# Patient Record
Sex: Male | Born: 1940
Health system: Southern US, Community
[De-identification: ages and names within clinical notes are randomized; demographics above are authoritative.]

## PROBLEM LIST (undated history)

## (undated) DIAGNOSIS — I714 Abdominal aortic aneurysm, without rupture, unspecified: Secondary | ICD-10-CM

## (undated) DIAGNOSIS — T7840XA Allergy, unspecified, initial encounter: Secondary | ICD-10-CM

## (undated) DIAGNOSIS — E119 Type 2 diabetes mellitus without complications: Secondary | ICD-10-CM

## (undated) DIAGNOSIS — H269 Unspecified cataract: Secondary | ICD-10-CM

## (undated) DIAGNOSIS — H409 Unspecified glaucoma: Secondary | ICD-10-CM

## (undated) DIAGNOSIS — I10 Essential (primary) hypertension: Secondary | ICD-10-CM

## (undated) DIAGNOSIS — K219 Gastro-esophageal reflux disease without esophagitis: Secondary | ICD-10-CM

## (undated) DIAGNOSIS — E785 Hyperlipidemia, unspecified: Secondary | ICD-10-CM

## (undated) HISTORY — DX: Gastro-esophageal reflux disease without esophagitis: K21.9

## (undated) HISTORY — DX: Essential (primary) hypertension: I10

## (undated) HISTORY — DX: Hyperlipidemia, unspecified: E78.5

## (undated) HISTORY — DX: Unspecified cataract: H26.9

## (undated) HISTORY — PX: TONSILLECTOMY AND ADENOIDECTOMY: SHX28

## (undated) HISTORY — DX: Abdominal aortic aneurysm, without rupture, unspecified: I71.40

## (undated) HISTORY — DX: Type 2 diabetes mellitus without complications: E11.9

## (undated) HISTORY — PX: CATARACT EXTRACTION: SUR2

## (undated) HISTORY — DX: Allergy, unspecified, initial encounter: T78.40XA

## (undated) HISTORY — DX: Unspecified glaucoma: H40.9

## (undated) HISTORY — DX: Abdominal aortic aneurysm, without rupture: I71.4

---

## 2002-10-03 HISTORY — PX: COLONOSCOPY: SHX174

## 2003-05-09 ENCOUNTER — Ambulatory Visit (HOSPITAL_COMMUNITY): Admission: RE | Admit: 2003-05-09 | Discharge: 2003-05-09 | Payer: Self-pay | Admitting: Gastroenterology

## 2010-04-22 ENCOUNTER — Encounter: Admission: RE | Admit: 2010-04-22 | Discharge: 2010-04-22 | Payer: Self-pay | Admitting: Nephrology

## 2013-04-24 ENCOUNTER — Ambulatory Visit (INDEPENDENT_AMBULATORY_CARE_PROVIDER_SITE_OTHER): Payer: Medicare Other | Admitting: Physician Assistant

## 2013-04-24 ENCOUNTER — Encounter: Payer: Self-pay | Admitting: Physician Assistant

## 2013-04-24 VITALS — BP 136/90 | HR 76 | Temp 98.1°F | Resp 18 | Ht 70.0 in | Wt 175.0 lb

## 2013-04-24 DIAGNOSIS — E785 Hyperlipidemia, unspecified: Secondary | ICD-10-CM

## 2013-04-24 DIAGNOSIS — E119 Type 2 diabetes mellitus without complications: Secondary | ICD-10-CM | POA: Insufficient documentation

## 2013-04-24 DIAGNOSIS — I10 Essential (primary) hypertension: Secondary | ICD-10-CM | POA: Insufficient documentation

## 2013-04-24 MED ORDER — ATORVASTATIN CALCIUM 10 MG PO TABS: 10.0000 mg | ORAL_TABLET | Freq: Every day | ORAL | Status: DC

## 2013-04-24 MED ORDER — CLONIDINE HCL 0.1 MG PO TABS: 0.1000 mg | ORAL_TABLET | Freq: Every day | ORAL | Status: DC

## 2013-04-24 NOTE — Progress Notes (Signed)
Patient ID: Aaron Mcfarland MRN: [id number], DOB: [birth year], 72 y.o. Date of Encounter: @DATE @  Chief Complaint:  Chief Complaint  Patient presents with  . new pt est care    old PCP retired    HPI: 72 y.o. year old AA male  presents for initial OV at this office. He states his prior PCP, Dr. Audrie Lia (in [place]), retired 3 mos ago. LOV there was 01/2013. Last full lab panel was at [facility] 8/13. His wife, Marcelyn Ditty, recently became pt here and has been pleased so recommended he come here also since his PCP retired.  He has no complaints today. Has had no angina symptoms.   Worked at the [employer]. Played Softball until age 47!!  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  Taking all mes as listed in Medication Tab-No adverse effects.      Past Medical History  Diagnosis Date  . Diabetes mellitus without complication   . Hyperlipidemia   . Hypertension      Home Meds: See attached medication section for current medication list. Any medications entered into computer today will not appear on this note's list. The medications listed below were entered prior to today. No current outpatient prescriptions on file prior to visit.   No current facility-administered medications on file prior to visit.    Allergies: No Known Allergies  History   Social History  . Marital Status: Married    Spouse Name: N/A    Number of Children: N/A  . Years of Education: N/A   Occupational History  . Not on file.   Social History Main Topics  . Smoking status: Former Smoker    Quit date: 04/24/1966  . Smokeless tobacco: Never Used  . Alcohol Use: Yes  . Drug Use: No  . Sexually Active: Yes    Birth Control/ Protection: None   Other Topics Concern  . Not on file   Social History Narrative  . No narrative on file    Family History  Problem Relation Age of Onset  . Cancer Mother     ovarian  . Hypertension Father   . Stroke Father   . Diabetes Brother   .  Heart disease Brother      Review of Systems:  See HPI for pertinent ROS. All other ROS negative.    Physical Exam: Blood pressure 136/90, pulse 76, temperature 98.1 F (36.7 C), temperature source Oral, resp. rate 18, height 5\' 10"  (1.778 m), weight 175 lb (79.379 kg)., Body mass index is 25.11 kg/(m^2). General:WNWD AAM.  Appears in no acute distress. Neck: Supple. No thyromegaly. No lymphadenopathy. No carotid Bruit.  Lungs: Clear bilaterally to auscultation without wheezes, rales, or rhonchi. Breathing is unlabored. Heart: RRR with S1 S2. No murmurs, rubs, or gallops. Abdomen: Soft, non-tender, non-distended with normoactive bowel sounds. No hepatomegaly. No rebound/guarding. No obvious abdominal masses. Musculoskeletal:  Strength and tone normal for age. Extremities/Skin: Warm and dry.  No edema. No rashes or suspicious lesions. Neuro: Alert and oriented X 3. Moves all extremities spontaneously. Gait is normal. CNII-XII grossly in tact. Psych:  Responds to questions appropriately with a normal affect.     ASSESSMENT AND PLAN:  72 y.o. year old male with  1. Diabetes mellitus without complication - COMPLETE METABOLIC PANEL WITH GFR - Hemoglobin A1c - Microalbumin, urine  2. Hyperlipidemia - COMPLETE METABOLIC PANEL WITH GFR - Lipid panel - atorvastatin (LIPITOR) 10 MG tablet; Take 1 tablet (10 mg total) by  mouth at bedtime.  Dispense: 30 tablet; Refill: 5  3. Hypertension - COMPLETE METABOLIC PANEL WITH GFR - cloNIDine (CATAPRES) 0.1 MG tablet; Take 1 tablet (0.1 mg total) by mouth daily.  Dispense: 60 tablet; Refill: 5  Will check labs to monitor. Cont current meds at this time. ROV 3 months, sooner if needed.  Will discuss preventive care, etc at next OV.   [address] [place], [place], [facility] 04/24/2013 5:19 PM

## 2013-04-25 ENCOUNTER — Telehealth: Payer: Self-pay | Admitting: Family Medicine

## 2013-04-25 LAB — COMPLETE METABOLIC PANEL WITH GFR
Albumin: 4.9 g/dL (ref 3.5–5.2)
CO2: 25 mEq/L (ref 19–32)
Chloride: 100 mEq/L (ref 96–112)
Creat: 0.95 mg/dL (ref 0.50–1.35)
GFR, Est Non African American: 80 mL/min
Glucose, Bld: 124 mg/dL — ABNORMAL HIGH (ref 70–99)
Potassium: 4.2 mEq/L (ref 3.5–5.3)
Total Protein: 7.9 g/dL (ref 6.0–8.3)

## 2013-04-25 LAB — LIPID PANEL: Cholesterol: 118 mg/dL (ref 0–200)

## 2013-04-25 NOTE — Telephone Encounter (Signed)
Pt aware of lab results and medications changes.  Has already made next follow up appt.

## 2013-04-25 NOTE — Telephone Encounter (Signed)
Message copied by Donne Anon on Thu Apr 25, 2013  6:06 PM ------      Message from: Allayne Butcher      Created: Thu Apr 25, 2013 12:40 PM       Cholesterol is very low. Can decrease lipitor-currnently on 10mg . If these can be cut in half, can decrease to 1/2 daily. Rest of labs are good. Cont all other meds the same as discussed at OV. ------

## 2013-06-26 ENCOUNTER — Telehealth: Payer: Self-pay | Admitting: Physician Assistant

## 2013-06-26 MED ORDER — METFORMIN HCL 1000 MG PO TABS: 1000.0000 mg | ORAL_TABLET | Freq: Two times a day (BID) | ORAL | Status: DC

## 2013-06-26 MED ORDER — TRIAMTERENE-HCTZ 37.5-25 MG PO TABS: 1.0000 | ORAL_TABLET | Freq: Every day | ORAL | Status: DC

## 2013-06-26 NOTE — Telephone Encounter (Signed)
Medication refilled per protocol. 

## 2013-06-26 NOTE — Telephone Encounter (Signed)
Maxide 25 mg 1 QD #30 Metformin 1000 mg 1 BID #60

## 2013-07-25 ENCOUNTER — Ambulatory Visit (INDEPENDENT_AMBULATORY_CARE_PROVIDER_SITE_OTHER): Payer: Medicare Other | Admitting: Physician Assistant

## 2013-07-25 ENCOUNTER — Other Ambulatory Visit: Payer: Self-pay | Admitting: Family Medicine

## 2013-07-25 ENCOUNTER — Encounter: Payer: Self-pay | Admitting: Physician Assistant

## 2013-07-25 VITALS — BP 120/76 | HR 72 | Temp 98.1°F | Resp 18 | Ht 69.0 in | Wt 174.0 lb

## 2013-07-25 DIAGNOSIS — Z23 Encounter for immunization: Secondary | ICD-10-CM

## 2013-07-25 DIAGNOSIS — I10 Essential (primary) hypertension: Secondary | ICD-10-CM

## 2013-07-25 DIAGNOSIS — E119 Type 2 diabetes mellitus without complications: Secondary | ICD-10-CM

## 2013-07-25 DIAGNOSIS — E785 Hyperlipidemia, unspecified: Secondary | ICD-10-CM

## 2013-07-25 MED ORDER — GLUCOSE BLOOD VI STRP: ORAL_STRIP | Status: DC

## 2013-07-25 NOTE — Telephone Encounter (Signed)
Test strips refilled

## 2013-07-26 NOTE — Progress Notes (Signed)
Patient ID: Aaron Mcfarland MRN: [id number], DOB: 1941/01/30, 72 y.o. Date of Encounter: @DATE @  Chief Complaint:  Chief Complaint  Patient presents with  . 3 mth check up    is fasting    HPI: 72 y.o. year old AA male  presents for followup office visit.  I saw him as any patient to our office on 04/24/13. At that time he reported that his prior PCP, Dr. Audrie Lia (in [place]), retired 3 months prior. He reported that the last office visit there was April 2014. He reported that his last full lab panel was at complete physical exam in August 2013. He reported that his wife, Aaron Mcfarland, recently became a patient at our office and had been pleased they recommended that he come to our office also since his PCP and retired.  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 He played softball until age 34!!! 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 He was a Naval architect.  AT that visit he had no complaints.  At that visit we made no changes to his medications. Obtain the labs and plan for followup visit in 3 months. He returns for that followup visit today.  He continues to have no complaints. He states that he walks 40 minutes twice a day. As well he uses his a laparoscopic cholecystectomy for 15 minutes per day and uses his rowing machine. He does all of his exercise 5 days per week.  As well he is careful with his diet and does monitor his carbohydrate intake.    Past Medical History  Diagnosis Date  . Diabetes mellitus without complication   . Hyperlipidemia   . Hypertension      Home Meds: See attached medication section for current medication list. Any medications entered into computer today will not appear on this note's list. The medications listed below were entered prior to today. Current Outpatient Prescriptions on File Prior to Visit  Medication Sig Dispense Refill  . atorvastatin (LIPITOR) 10 MG tablet Take 1 tablet (10 mg total) by mouth at bedtime.  30 tablet  5  .  cetirizine (ZYRTEC) 10 MG tablet Take 10 mg by mouth daily.      . cloNIDine (CATAPRES) 0.1 MG tablet Take 1 tablet (0.1 mg total) by mouth daily.  60 tablet  5  . Lancets (FREESTYLE) lancets 1 each by Other route daily. Use as instructed      . metFORMIN (GLUCOPHAGE) 1000 MG tablet Take 1 tablet (1,000 mg total) by mouth 2 (two) times daily with a meal.  60 tablet  1  . Multiple Vitamins-Minerals (MULTIVITAMIN PO) Take by mouth.      . triamterene-hydrochlorothiazide (MAXZIDE-25) 37.5-25 MG per tablet Take 1 tablet by mouth daily.  30 tablet  1   No current facility-administered medications on file prior to visit.    Allergies: No Known Allergies  History   Social History  . Marital Status: Married    Spouse Name: N/A    Number of Children: N/A  . Years of Education: N/A   Occupational History  . Not on file.   Social History Main Topics  . Smoking status: Former Smoker    Quit date: 04/24/1966  . Smokeless tobacco: Never Used  . Alcohol Use: Yes  . Drug Use: No  . Sexual Activity: Yes    Birth Control/ Protection: None   Other Topics Concern  . Not on file   Social History Narrative  . No narrative  on file    Family History  Problem Relation Age of Onset  . Cancer Mother     ovarian  . Hypertension Father   . Stroke Father   . Diabetes Brother   . Heart disease Brother      Review of Systems:  See HPI for pertinent ROS. All other ROS negative.    Physical Exam: Blood pressure 120/76, pulse 72, temperature 98.1 F (36.7 C), temperature source Oral, resp. rate 18, height 5\' 9"  (1.753 m), weight 174 lb (78.926 kg)., Body mass index is 25.68 kg/(m^2). General: WNWD AAM. Appears in no acute distress. Neck: Supple. No thyromegaly. No lymphadenopathy. No carotid bruits. Lungs: Clear bilaterally to auscultation without wheezes, rales, or rhonchi. Breathing is unlabored. Heart: RRR with S1 S2. No murmurs, rubs, or gallops. Abdomen: Soft, non-tender, non-distended  with normoactive bowel sounds. No hepatomegaly. No rebound/guarding. No obvious abdominal masses. Musculoskeletal:  Strength and tone normal for age. Extremities/Skin: Warm and dry. No clubbing or cyanosis. No edema. No rashes or suspicious lesions. Neuro: Alert and oriented X 3. Moves all extremities spontaneously. Gait is normal. CNII-XII grossly in tact. Psych:  Responds to questions appropriately with a normal affect. Diabetic foot exam:  Inspection is normal with no open wounds and no calluses or concerning areas. Sensation is intact throughout. He has 2+ dorsalis pedis and posterior tibial pulses bilaterally.      ASSESSMENT AND PLAN:  72 y.o. year old male with  1. Diabetes mellitus without complication Microalbumin was performed July 2014 and was normal. - Hemoglobin A1C, fingerstick  2. Hyperlipidemia Lipid panel was performed 04/25/13. LDL was down at 43. I recommended that he could decrease his Lipitor and cut it in. He states that he didn't get that message and did follow up with that. He is taking one half of his Lipitor 10 daily 5 mg daily. LFT was normal 04/25/13 We'll wait to recheck FLP and LFTs at next visit.  3. Hypertension BP is at goal at 120/76 today. Continue current medication. BMET was normal 04/25/13  #4 colorectal cancer screening: Patient states that his last colonoscopy was 10 years ago. He states that he just recently received a letter informing him to schedule followup. He states that he will schedule this and is aware.  5. prostate cancer screening: No further screening is indicated as he is now age 72.  6. Immunizations: Influenza vaccine: He reports that he is only gotten a flu vaccine for this year. He reports he knows he has not had a tetanus shot in the last 10 years. Agreeable to update now. As well he reports that he knows he has not had a pneumonia vaccine. Agreeable to update now.  4. Need for Tdap vaccination - Tdap vaccine greater than or  equal to 7yo IM  5. Need for pneumococcal vaccine - Pneumococcal polysaccharide vaccine 23-valent greater than or equal to 2yo subcutaneous/IM  Zostavax: As this immunization. Discussed that he needs to check with his insurance regarding coverage said he knows his cost and he can decide whether he was to follow up with this.  Routine office visit in 3 months or sooner if he needs this.  [address] [place], [place], [facility] 07/26/2013 7:30 PM

## 2013-08-02 ENCOUNTER — Telehealth: Payer: Self-pay | Admitting: Family Medicine

## 2013-08-02 NOTE — Telephone Encounter (Signed)
Pt aware of lab results and provider recommendations 

## 2013-08-02 NOTE — Telephone Encounter (Signed)
Message copied by Donne Anon on Fri Aug 02, 2013  2:10 PM ------      Message from: Allayne Butcher      Created: Thu Jul 25, 2013  7:05 PM       Since A1c is down to 5.9.      Currently on metformin 1000 mg twice a day.      I am concerned he's going to have hypoglycemia episodes.       decrease metformin to 500 mg twice a day.      Send Prescription for this #60 with 5 refills. ------

## 2013-10-28 ENCOUNTER — Encounter: Payer: Self-pay | Admitting: Physician Assistant

## 2013-10-28 ENCOUNTER — Ambulatory Visit (INDEPENDENT_AMBULATORY_CARE_PROVIDER_SITE_OTHER): Payer: Medicare Other | Admitting: Physician Assistant

## 2013-10-28 VITALS — BP 114/70 | HR 68 | Temp 97.8°F | Resp 18 | Wt 178.0 lb

## 2013-10-28 DIAGNOSIS — E119 Type 2 diabetes mellitus without complications: Secondary | ICD-10-CM

## 2013-10-28 DIAGNOSIS — B9689 Other specified bacterial agents as the cause of diseases classified elsewhere: Secondary | ICD-10-CM

## 2013-10-28 DIAGNOSIS — I10 Essential (primary) hypertension: Secondary | ICD-10-CM

## 2013-10-28 DIAGNOSIS — Z1212 Encounter for screening for malignant neoplasm of rectum: Secondary | ICD-10-CM

## 2013-10-28 DIAGNOSIS — E785 Hyperlipidemia, unspecified: Secondary | ICD-10-CM

## 2013-10-28 DIAGNOSIS — A499 Bacterial infection, unspecified: Secondary | ICD-10-CM

## 2013-10-28 DIAGNOSIS — Z1211 Encounter for screening for malignant neoplasm of colon: Secondary | ICD-10-CM

## 2013-10-28 DIAGNOSIS — J069 Acute upper respiratory infection, unspecified: Secondary | ICD-10-CM

## 2013-10-28 LAB — LIPID PANEL
CHOL/HDL RATIO: 2.2 ratio
Cholesterol: 131 mg/dL (ref 0–200)
HDL: 60 mg/dL (ref >39)
LDL Cholesterol: 44 mg/dL (ref 0–99)
Triglycerides: 136 mg/dL (ref <150)
VLDL: 27 mg/dL (ref 0–40)

## 2013-10-28 LAB — COMPLETE METABOLIC PANEL WITH GFR
ALBUMIN: 4.6 g/dL (ref 3.5–5.2)
ALT: 20 U/L (ref 0–53)
AST: 16 U/L (ref 0–37)
Alkaline Phosphatase: 74 U/L (ref 39–117)
BUN: 17 mg/dL (ref 6–23)
CHLORIDE: 102 meq/L (ref 96–112)
CO2: 27 meq/L (ref 19–32)
CREATININE: 1.07 mg/dL (ref 0.50–1.35)
Calcium: 10.5 mg/dL (ref 8.4–10.5)
GFR, Est African American: 79 mL/min
GFR, Est Non African American: 68 mL/min
GLUCOSE: 143 mg/dL — AB (ref 70–99)
Potassium: 4.3 mEq/L (ref 3.5–5.3)
Sodium: 138 mEq/L (ref 135–145)
TOTAL PROTEIN: 7.7 g/dL (ref 6.0–8.3)
Total Bilirubin: 0.8 mg/dL (ref 0.3–1.2)

## 2013-10-28 LAB — HEMOGLOBIN A1C
Hgb A1c MFr Bld: 6.6 % — ABNORMAL HIGH (ref <5.7)
MEAN PLASMA GLUCOSE: 143 mg/dL — AB (ref <117)

## 2013-10-28 MED ORDER — AMOXICILLIN-POT CLAVULANATE 875-125 MG PO TABS: 1.0000 | ORAL_TABLET | Freq: Two times a day (BID) | ORAL | Status: DC

## 2013-10-28 NOTE — Progress Notes (Signed)
Patient ID: Aaron Mcfarland MRN: [id number], DOB: June 14, 1941, 73 y.o. Date of Encounter: @DATE @  Chief Complaint:  Chief Complaint  Patient presents with  . 3 mth check up    is fasting    HPI: 73 y.o. year old AA male  presents for routine followup office visit.  Today he has one complaint. States he has had thick mucus from his nose for the past month. Also drainage to his throat. No congestion in his chest. Has had no fevers or chills. He has even checked his temperature sometimes and has had no fever.  I saw him as a new patient to our office on 04/24/13. At that time he reported that his prior PCP, who was in [place], had retired 3 months prior. He reported that his last visit there was April 2014. He reported that his wife, [name], recently became a patient at this office and had been pleased. She recommended that he come to our office also since his PCP retired. 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 Played softball until age 66!!! 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 He was a Industrial/product designer.   At His last visit he stated he was walking 40 minutes twice a day. He does this walking with his wife. Today says that b/c of the weather, they are now doing this once a day around 2 PM. He is still also doing his elliptical and bicycle for 15 minutes per day and uses a rowing machine. All does exercise 5 days per week.  He checks his blood pressure every day and gets good readings. He checks his blood sugar every morning and gets between 90-120. He did not bring in the log sheet today.  At prior lab we have told him to cut his Lipitor dose in half and he says that he did this. As well at last lab we have told him to cut his metformin down in half from 1000 twice a day to 500 twice a day. He also reports that he did make this change.  Taking  all medications as directed with no adverse effects.   Past Medical History  Diagnosis Date  . Diabetes mellitus without  complication   . Hyperlipidemia   . Hypertension      Home Meds: See attached medication section for current medication list. Any medications entered into computer today will not appear on this note's list. The medications listed below were entered prior to today. Current Outpatient Prescriptions on File Prior to Visit  Medication Sig Dispense Refill  . atorvastatin (LIPITOR) 10 MG tablet Take 1 tablet (10 mg total) by mouth at bedtime.  30 tablet  5  . cetirizine (ZYRTEC) 10 MG tablet Take 10 mg by mouth daily.      . cloNIDine (CATAPRES) 0.1 MG tablet Take 1 tablet (0.1 mg total) by mouth daily.  60 tablet  5  . FREESTYLE TEST STRIPS test strip       . Lancets (FREESTYLE) lancets 1 each by Other route daily. Use as instructed      . metFORMIN (GLUCOPHAGE) 500 MG tablet Take 500 mg by mouth 2 (two) times daily with a meal.      . Multiple Vitamins-Minerals (MULTIVITAMIN PO) Take by mouth.      . triamterene-hydrochlorothiazide (MAXZIDE-25) 37.5-25 MG per tablet Take 1 tablet by mouth daily.  30 tablet  1   No current facility-administered medications on file prior to visit.  Allergies: No Known Allergies  History   Social History  . Marital Status: Married    Spouse Name: N/A    Number of Children: N/A  . Years of Education: N/A   Occupational History  . Not on file.   Social History Main Topics  . Smoking status: Former Smoker    Quit date: 04/24/1966  . Smokeless tobacco: Never Used  . Alcohol Use: Yes  . Drug Use: No  . Sexual Activity: Yes    Birth Control/ Protection: None   Other Topics Concern  . Not on file   Social History Narrative  . No narrative on file    Family History  Problem Relation Age of Onset  . Cancer Mother     ovarian  . Hypertension Father   . Stroke Father   . Diabetes Brother   . Heart disease Brother      Review of Systems:  See HPI for pertinent ROS. All other ROS negative.    Physical Exam: Blood pressure 114/70, pulse  68, temperature 97.8 F (36.6 C), temperature source Oral, resp. rate 18, weight 178 lb (80.74 kg)., Body mass index is 26.27 kg/(m^2). General: WNWD AAM. Appears in no acute distress. Head: Normocephalic, atraumatic, eyes without discharge, sclera non-icteric, nares are without discharge. Bilateral auditory canals clear, TM's are without perforation, pearly grey and translucent with reflective cone of light bilaterally. Oral cavity moist, posterior pharynx without exudate, erythema, peritonsillar abscess. No tenderness of the sinuses with percussion. Neck: Supple. No thyromegaly. No lymphadenopathy. No carotid bruits. Lungs: Clear bilaterally to auscultation without wheezes, rales, or rhonchi. Breathing is unlabored. Heart: RRR with S1 S2. No murmurs, rubs, or gallops. Abdomen: Soft, non-tender, non-distended with normoactive bowel sounds. No hepatomegaly. No rebound/guarding. No obvious abdominal masses. Musculoskeletal:  Strength and tone normal for age. Extremities/Skin: Warm and dry. No clubbing or cyanosis. No edema. No rashes or suspicious lesions. Neuro: Alert and oriented X 3. Moves all extremities spontaneously. Gait is normal. CNII-XII grossly in tact. Psych:  Responds to questions appropriately with a normal affect. Diabetic foot exam: Inspection is normal. No other wounds and no calluses or concerning areas. Is intact throughout. He has 2+ dorsalis pedis and posterior tibial pulses bilaterally.      ASSESSMENT AND PLAN:  73 y.o. year old male with  1. Diabetes mellitus without complication - COMPLETE METABOLIC PANEL WITH GFR - Hemoglobin A1c  He reports he has not had an eye exam in almost 15 years. Discussed need for diabetic eye exam. He is on statin. He is on no ACE inhibitor.  Next office visit we'll discuss decreasing his current blood pressure medication so that we can start a low-dose ACE inhibitor.   2. Hyperlipidemia - COMPLETE METABOLIC PANEL WITH GFR - Lipid  panel He did decrease his Lipitor from 10 mg to half of a pill for 5 mg at the time of his labs 7/14. Recheck now.  3. Hypertension - COMPLETE METABOLIC PANEL WITH GFR Blood pressure is at goal. He is on no ACE inhibitor. His next office visit we will discuss decreasing his dose of current medications and we can start low-dose ACE inhibitor.  4. Screening for colorectal cancer At his last visit we discussed the fact that his last colonoscopy was 10 years ago. He told me he just received a letter informing him to schedule followup. Today I  asked him about this and he said he called his insurance and they were going to cover 80% but not  the full cost. He states that he cannot afford this. I discussed getting Hemoccults and he agrees with doing this today.  5. prostate cancer screening: No further screening indicated as he is now age 42.  57. Immunizations:  Influenza vaccine:He has had for this year Tetanus: This was updated at our visit 07/25/13 Pneumonia vaccine: He was given Pneumovax 23 at a visit 07/25/13. Zostavax: Discussed this at the last visit. He says that he checked and his cost is going to be $95 and he defers.  7. Bacterial upper respiratory infection - amoxicillin-clavulanate (AUGMENTIN) 875-125 MG per tablet; Take 1 tablet by mouth 2 (two) times daily.  Dispense: 20 tablet; Refill: 0  Office visit 3 months or sooner if needed.  [address] [place], [place], [facility] 10/28/2013 9:53 AM

## 2013-10-29 ENCOUNTER — Telehealth: Payer: Self-pay | Admitting: Family Medicine

## 2013-10-29 NOTE — Telephone Encounter (Signed)
Message copied by Olena Mater on Tue Oct 29, 2013 10:10 AM ------      Message from: Dena Billet      Created: Tue Oct 29, 2013  8:37 AM       Tell Patient that his A1c is good. Continue current diabetic medication the same.      At the time of his prior cholesterol panel, we had decreased the dose of his medication to where he is only taking a half of a very low dose pill daily. Tell him that the cholesterol number is still very low. Can actually take a half of a pill just on Monday Wednesday Fridays only.      Rest of labs are normal. Other than decreasing the cholesterol pill as above, continue all other medications the same as at present. ------

## 2013-10-29 NOTE — Telephone Encounter (Signed)
Pt aware of lab results and provider recommendations 

## 2013-10-31 ENCOUNTER — Other Ambulatory Visit: Payer: Medicare Other

## 2013-10-31 DIAGNOSIS — Z1212 Encounter for screening for malignant neoplasm of rectum: Principal | ICD-10-CM

## 2013-10-31 DIAGNOSIS — Z1211 Encounter for screening for malignant neoplasm of colon: Secondary | ICD-10-CM

## 2013-11-01 LAB — FECAL OCCULT BLOOD, IMMUNOCHEMICAL
FECAL OCCULT BLOOD: NEGATIVE
FECAL OCCULT BLOOD: NEGATIVE
Fecal Occult Blood: NEGATIVE

## 2013-11-02 ENCOUNTER — Other Ambulatory Visit: Payer: Self-pay | Admitting: Physician Assistant

## 2013-11-04 NOTE — Telephone Encounter (Signed)
Medication refilled per protocol. 

## 2013-11-05 ENCOUNTER — Encounter: Payer: Self-pay | Admitting: Family Medicine

## 2013-11-13 ENCOUNTER — Telehealth: Payer: Self-pay | Admitting: Physician Assistant

## 2013-11-13 NOTE — Telephone Encounter (Signed)
Pt states better but still with some lingering congestion.  No fever.  No discolored secretions.  Told him antibiotic has killed the bacteria but needs some OTC expectorant to help him clear the congestion.  Recommended Mucinex.  Follow package directions and drink plenty of fluids.  If still not gone or fever or discolored secretions return, we will need to see to refill antibiotics

## 2013-11-13 NOTE — Telephone Encounter (Signed)
Call back number is [phone] Pharmacy is [facility] Pt is wanting a refill on  amoxicillin-clavulanate (AUGMENTIN) 875-125 MG per tablet He is not getting any better

## 2013-11-14 NOTE — Telephone Encounter (Signed)
Agree 

## 2014-01-27 ENCOUNTER — Ambulatory Visit (INDEPENDENT_AMBULATORY_CARE_PROVIDER_SITE_OTHER): Payer: Medicare Other | Admitting: Physician Assistant

## 2014-01-27 ENCOUNTER — Encounter: Payer: Self-pay | Admitting: Physician Assistant

## 2014-01-27 VITALS — BP 130/84 | HR 68 | Temp 97.6°F | Resp 18 | Wt 177.0 lb

## 2014-01-27 DIAGNOSIS — I10 Essential (primary) hypertension: Secondary | ICD-10-CM

## 2014-01-27 DIAGNOSIS — Z1212 Encounter for screening for malignant neoplasm of rectum: Secondary | ICD-10-CM

## 2014-01-27 DIAGNOSIS — E119 Type 2 diabetes mellitus without complications: Secondary | ICD-10-CM

## 2014-01-27 DIAGNOSIS — Z1211 Encounter for screening for malignant neoplasm of colon: Secondary | ICD-10-CM

## 2014-01-27 DIAGNOSIS — E785 Hyperlipidemia, unspecified: Secondary | ICD-10-CM

## 2014-01-27 LAB — HEMOGLOBIN A1C
Hgb A1c MFr Bld: 6.8 % — ABNORMAL HIGH (ref <5.7)
MEAN PLASMA GLUCOSE: 148 mg/dL — AB (ref <117)

## 2014-01-27 MED ORDER — BENAZEPRIL HCL 10 MG PO TABS
10.0000 mg | ORAL_TABLET | Freq: Every day | ORAL | Status: DC
Start: 2014-01-27 — End: 2014-02-10

## 2014-01-27 MED ORDER — ASPIRIN 81 MG PO TABS: 81.0000 mg | ORAL_TABLET | Freq: Every day | ORAL | Status: AC

## 2014-01-27 NOTE — Progress Notes (Signed)
Patient ID: Aaron Mcfarland MRN: [id number], DOB: Dec 07, 1940, 73 y.o. Date of Encounter: @DATE @  Chief Complaint:  Chief Complaint  Patient presents with  . 3 mth check up    is fasting    HPI: 73 y.o. year old AA male  presents for routine followup office visit.   I saw him as a new patient to our office on 04/24/13. At that time he reported that his prior PCP, who was in [place], had retired 3 months prior. He reported that his last visit there was April 2014. He reported that his wife, Aaron Mcfarland, recently became a patient at this office and had been pleased. She recommended that he come to our office also since his PCP retired. 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 Played softball until age 45!!! 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   At prior visit he stated he was walking 40 minutes twice a day. He does this walking with his wife. At [place] in January 2015-- he  said that b/c of the weather, they are now doing this once a day around 2 PM. He is still also doing his elliptical and bicycle for 15 minutes per day and uses a rowing machine. All does exercise 5 days per week.  He checks his blood pressure every day and gets good readings. He checks his blood sugar every morning and gets between 90-120. He did not bring in the log sheet today.  At prior lab we have told him to cut his Lipitor dose in half and he says that he did this. At lab 10/2013 told him to take 1/2 on MOn, Wed, Fri only--says he is doing this.  As well at last lab we have told him to cut his metformin down in half from 1000 twice a day to 500 twice a day. He also reports that he did make this change.  Taking  all medications as directed with no adverse effects.   Past Medical History  Diagnosis Date  . Diabetes mellitus without complication   . Hyperlipidemia   . Hypertension      Home Meds: See attached medication section for current medication list. Any  medications entered into computer today will not appear on this note's list. The medications listed below were entered prior to today. Current Outpatient Prescriptions on File Prior to Visit  Medication Sig Dispense Refill  . atorvastatin (LIPITOR) 10 MG tablet Take 1 tablet (10 mg total) by mouth at bedtime.  30 tablet  5  . cetirizine (ZYRTEC) 10 MG tablet Take 10 mg by mouth daily.      Marland Kitchen FREESTYLE TEST STRIPS test strip       . Lancets (FREESTYLE) lancets 1 each by Other route daily. Use as instructed      . metFORMIN (GLUCOPHAGE) 500 MG tablet Take 500 mg by mouth 2 (two) times daily with a meal.      . Multiple Vitamins-Minerals (MULTIVITAMIN PO) Take by mouth.      . triamterene-hydrochlorothiazide (MAXZIDE-25) 37.5-25 MG per tablet TAKE ONE TABLET BY MOUTH ONCE DAILY  30 tablet  2   No current facility-administered medications on file prior to visit.    Allergies: No Known Allergies  History   Social History  . Marital Status: Married    Spouse Name: N/A    Number of Children: N/A  . Years of Education: N/A   Occupational History  . Not on file.  Social History Main Topics  . Smoking status: Former Smoker    Quit date: 04/24/1966  . Smokeless tobacco: Never Used  . Alcohol Use: Yes  . Drug Use: No  . Sexual Activity: Yes    Birth Control/ Protection: None   Other Topics Concern  . Not on file   Social History Narrative  . No narrative on file    Family History  Problem Relation Age of Onset  . Cancer Mother     ovarian  . Hypertension Father   . Stroke Father   . Diabetes Brother   . Heart disease Brother      Review of Systems:  See HPI for pertinent ROS. All other ROS negative.    Physical Exam: Blood pressure 130/84, pulse 68, temperature 97.6 F (36.4 C), temperature source Oral, resp. rate 18, weight 177 lb (80.287 kg)., Body mass index is 26.13 kg/(m^2). General: WNWD AAM. Appears in no acute distress. Head: Normocephalic, atraumatic, eyes  without discharge, sclera non-icteric, nares are without discharge. Bilateral auditory canals clear, TM's are without perforation, pearly grey and translucent with reflective cone of light bilaterally. Oral cavity moist, posterior pharynx without exudate, erythema, peritonsillar abscess. No tenderness of the sinuses with percussion. Neck: Supple. No thyromegaly. No lymphadenopathy. No carotid bruits. Lungs: Clear bilaterally to auscultation without wheezes, rales, or rhonchi. Breathing is unlabored. Heart: RRR with S1 S2. No murmurs, rubs, or gallops. Abdomen: Soft, non-tender, non-distended with normoactive bowel sounds. No hepatomegaly. No rebound/guarding. No obvious abdominal masses. Musculoskeletal:  Strength and tone normal for age. Extremities/Skin: Warm and dry. No clubbing or cyanosis. No edema. No rashes or suspicious lesions. Neuro: Alert and oriented X 3. Moves all extremities spontaneously. Gait is normal. CNII-XII grossly in tact. Psych:  Responds to questions appropriately with a normal affect. Diabetic foot exam: Inspection is normal. No other wounds and no calluses or concerning areas. Is intact throughout. He has 2+ dorsalis pedis and posterior tibial pulses bilaterally.      ASSESSMENT AND PLAN:  73 y.o. year old male with  1. Diabetes mellitus without complication  - Hemoglobin A1c  He reports he has not had an eye exam in almost 15 years. Discussed need for diabetic eye exam at [place]. Today he reports that he does have appt scheduled--for May 5th. He is on statin. He is on no ACE inhibitor.  Will stop clonidine and replace it with benazepril 10mg  QD.  Pt agreeable and agreeable to RTC for BMET in 1 - 2 weeks.  Was on no aspirin so I have added aspirin 81 mg daily today. He has no contraindication to this. Last microalbumin 04/2013.  2. Hyperlipidemia He had lipid panel and LFTs at last visit. Lipids excellent and we actually had him decrease his Lipitor to half of a pill  on Monday Wednesday Friday only and he is taking this dose. LFTs were normal. - 3. Hypertension Stop clonidine so that we can replace this with an ACE inhibitor which we are starting benazepril 10 mg daily. He will return to clinic in one to 2 weeks to check BMET.  4. Screening for colorectal cancer At prior office visit we discussed the fact that his last colonoscopy was 10 years ago. He told me he just received a letter informing him to schedule followup.  At [place] 10/2013 I  asked him about this and he said he called his insurance and they were going to cover 80% but not the full cost. He stated that he cannot  afford this. I discussed getting Hemoccults and he agreed with doing this. He did turn in  his Hemoccult x3 and they were negative x3.  5. prostate cancer screening: No further screening indicated as he is now age 79.  71. Immunizations:  Influenza vaccine:He has had for this year Tetanus: This was updated at our visit 07/25/13 Pneumonia vaccine: He was given Pneumovax 23 at a visit 07/25/13. AT LEAST 12 MONTHS SHOULD LAPSE, THEN GIVE PREVNAR 13 Zostavax: Discussed this at the last visit. He says that he checked and his cost is going to be $95 and he defers.    ORDERS PALED TODAY:  1. Diabetes mellitus without complication - Hemoglobin A1c - aspirin 81 MG tablet; Take 1 tablet (81 mg total) by mouth daily.  Dispense: 30 tablet; Refill: 11 - benazepril (LOTENSIN) 10 MG tablet; Take 1 tablet (10 mg total) by mouth daily.  Dispense: 30 tablet; Refill: 3 - BASIC METABOLIC PANEL WITH GFR; Future  2. Hyperlipidemia  3. Hypertension - benazepril (LOTENSIN) 10 MG tablet; Take 1 tablet (10 mg total) by mouth daily.  Dispense: 30 tablet; Refill: 3 - BASIC METABOLIC PANEL WITH GFR; Future   Labs in 1-2 weeks.  Office visit 3 months or sooner if needed.  Signed, [address] [place], [place], [facility] 01/27/2014 9:59 AM

## 2014-02-10 ENCOUNTER — Ambulatory Visit (INDEPENDENT_AMBULATORY_CARE_PROVIDER_SITE_OTHER): Payer: Medicare Other | Admitting: Physician Assistant

## 2014-02-10 ENCOUNTER — Encounter: Payer: Self-pay | Admitting: Physician Assistant

## 2014-02-10 VITALS — BP 144/80 | HR 68 | Temp 97.9°F | Resp 18 | Wt 180.0 lb

## 2014-02-10 DIAGNOSIS — E119 Type 2 diabetes mellitus without complications: Secondary | ICD-10-CM

## 2014-02-10 DIAGNOSIS — I10 Essential (primary) hypertension: Secondary | ICD-10-CM

## 2014-02-10 LAB — BASIC METABOLIC PANEL WITH GFR
BUN: 14 mg/dL (ref 6–23)
CHLORIDE: 103 meq/L (ref 96–112)
CO2: 28 meq/L (ref 19–32)
Calcium: 10.5 mg/dL (ref 8.4–10.5)
Creat: 1.06 mg/dL (ref 0.50–1.35)
GFR, EST AFRICAN AMERICAN: 80 mL/min
GFR, Est Non African American: 69 mL/min
GLUCOSE: 133 mg/dL — AB (ref 70–99)
Potassium: 4.7 mEq/L (ref 3.5–5.3)
Sodium: 139 mEq/L (ref 135–145)

## 2014-02-10 MED ORDER — BENAZEPRIL HCL 10 MG PO TABS: 10.0000 mg | ORAL_TABLET | Freq: Every day | ORAL | Status: DC

## 2014-02-10 NOTE — Progress Notes (Signed)
Patient ID: Aaron Mcfarland MRN: [id number], DOB: Mar 18, 1941, 73 y.o. Date of Encounter: @DATE @  Chief Complaint:  Chief Complaint  Patient presents with  . follow up BP after med change    HPI: 73 y.o. year old AA male  presents for above.  His last routine 3 month followup office visit with me was 01/27/14. At that visit we discussed the fact that he was on no ACE inhibitor. Explained to him that  ACE inhibitor would protect his kidneys from the effects of diabetes over time. Recommended that we stop the clonidine and replace it with benazepril 10 mg daily. Patient was agreeable to make that change. Today he reports that he did stop the clonidine. As well he has started benazepril  10 mg and has been taking it daily. He has no adverse effects. He has no complaints today.    I saw him as a new patient to our office on 04/24/13. At that time he reported that his prior PCP, who was in [place], had retired 3 months prior. He reported that his last visit there was April 2014. He reported that his wife, Aaron Mcfarland, recently became a patient at this office and had been pleased. She recommended that he come to our office also since his PCP retired. 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 Played softball until age 38!!! 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   At prior visit he stated he was walking 40 minutes twice a day. He does this walking with his wife. At [place] in January 2015-- he  said that b/c of the weather, they are now doing this once a day around 2 PM. He is still also doing his elliptical and bicycle for 15 minutes per day and uses a rowing machine. All does exercise 5 days per week.  He checks his blood pressure every day and gets good readings. He checks his blood sugar every morning and gets between 90-120. He did not bring in the log sheet today.  At prior lab we have told him to cut his Lipitor dose in half and he says that he did  this. At lab 10/2013 told him to take 1/2 on MOn, Wed, Fri only--says he is doing this.  As well at last lab we have told him to cut his metformin down in half from 1000 twice a day to 500 twice a day. He also reports that he did make this change.  Taking  all medications as directed with no adverse effects.   Past Medical History  Diagnosis Date  . Diabetes mellitus without complication   . Hyperlipidemia   . Hypertension      Home Meds: See attached medication section for current medication list. Any medications entered into computer today will not appear on this note's list. The medications listed below were entered prior to today. Current Outpatient Prescriptions on File Prior to Visit  Medication Sig Dispense Refill  . aspirin 81 MG tablet Take 1 tablet (81 mg total) by mouth daily.  30 tablet  11  . atorvastatin (LIPITOR) 10 MG tablet Take 1 tablet (10 mg total) by mouth at bedtime.  30 tablet  5  . cetirizine (ZYRTEC) 10 MG tablet Take 10 mg by mouth daily.      Marland Kitchen FREESTYLE TEST STRIPS test strip       . Lancets (FREESTYLE) lancets 1 each by Other route daily. Use as instructed      .  metFORMIN (GLUCOPHAGE) 500 MG tablet Take 500 mg by mouth 2 (two) times daily with a meal.      . Multiple Vitamins-Minerals (MULTIVITAMIN PO) Take by mouth.      . triamterene-hydrochlorothiazide (MAXZIDE-25) 37.5-25 MG per tablet TAKE ONE TABLET BY MOUTH ONCE DAILY  30 tablet  2   No current facility-administered medications on file prior to visit.    Allergies: No Known Allergies  History   Social History  . Marital Status: Married    Spouse Name: N/A    Number of Children: N/A  . Years of Education: N/A   Occupational History  . Not on file.   Social History Main Topics  . Smoking status: Former Smoker    Quit date: 04/24/1966  . Smokeless tobacco: Never Used  . Alcohol Use: Yes  . Drug Use: No  . Sexual Activity: Yes    Birth Control/ Protection: None   Other Topics Concern   . Not on file   Social History Narrative  . No narrative on file    Family History  Problem Relation Age of Onset  . Cancer Mother     ovarian  . Hypertension Father   . Stroke Father   . Diabetes Brother   . Heart disease Brother      Review of Systems:  See HPI for pertinent ROS. All other ROS negative.    Physical Exam: Blood pressure 144/80, pulse 68, temperature 97.9 F (36.6 C), temperature source Oral, resp. rate 18, weight 180 lb (81.647 kg)., Body mass index is 26.57 kg/(m^2). General: WNWD AAM. Appears in no acute distress. Head: Normocephalic, atraumatic, eyes without discharge, sclera non-icteric, nares are without discharge. Bilateral auditory canals clear, TM's are without perforation, pearly grey and translucent with reflective cone of light bilaterally. Oral cavity moist, posterior pharynx without exudate, erythema, peritonsillar abscess. No tenderness of the sinuses with percussion. Neck: Supple. No thyromegaly. No lymphadenopathy. No carotid bruits. Lungs: Clear bilaterally to auscultation without wheezes, rales, or rhonchi. Breathing is unlabored. Heart: RRR with S1 S2. No murmurs, rubs, or gallops. Abdomen: Soft, non-tender, non-distended with normoactive bowel sounds. No hepatomegaly. No rebound/guarding. No obvious abdominal masses. Musculoskeletal:  Strength and tone normal for age. Extremities/Skin: Warm and dry. No clubbing or cyanosis. No edema. No rashes or suspicious lesions. Neuro: Alert and oriented X 3. Moves all extremities spontaneously. Gait is normal. CNII-XII grossly in tact. Psych:  Responds to questions appropriately with a normal affect. Diabetic foot exam: Inspection is normal. No other wounds and no calluses or concerning areas. Is intact throughout. He has 2+ dorsalis pedis and posterior tibial pulses bilaterally.      ASSESSMENT AND PLAN:  73 y.o. year old male with    TODAY'S DIAGNOSES AND ORDERS:   1. Diabetes mellitus without  complication - BASIC METABOLIC PANEL WITH GFR - benazepril (LOTENSIN) 10 MG tablet; Take 1 tablet (10 mg total) by mouth daily.  Dispense: 30 tablet; Refill: 5  2. Hypertension I rechecked blood pressure and got 132/90. Will continue current medication. Will check labs to monitor. Monitor blood pressure at next visit. If BP continues to be borderline high then will need to adjust medications. Patient reports that he has not checked his blood pressure since his last visit here to have any other blood pressure readings. He will have another followup visit in 3 months so we can closely monitor this. - BASIC METABOLIC PANEL WITH GFR - benazepril (LOTENSIN) 10 MG tablet; Take 1 tablet (10 mg total)  by mouth daily.  Dispense: 30 tablet; Refill: 5   INFO FOR ROUTINE 3 MONTH OFFICE VISITS: (copied form [facility] 01/27/14): 1. Diabetes mellitus without complication  - Hemoglobin A1c  At [facility] he reported he had not had an eye exam in almost 15 years. We had discussed need for diabetic eye exam at prior [facility]. At [facility] 01/27/14  he reported that he does have appt scheduled--for May 5th. THIS IS THE ONLY SECTION OF THE 01/27/14 OV NOTE THAT WAS UPDATED/ADDRESSED AT VISIT 02/10/2014. TODAY--02/10/14--HE REPORTS THAT HE DID SEE EYE DOCTOR. Has Glaucoma Left Eye. Has Cataract Right Eye. Also says he is getting glasses.  He is on statin. He is on no ACE inhibitor.  Will stop clonidine and replace it with benazepril 10mg  QD.  Pt agreeable and agreeable to RTC for BMET in 1 - 2 weeks.  Was on no aspirin so I have added aspirin 81 mg daily today. He has no contraindication to this. Last microalbumin 04/2013.  2. Hyperlipidemia He had lipid panel and LFTs at last visit. Lipids excellent and we actually had him decrease his Lipitor to half of a pill on Monday Wednesday Friday only and he is taking this dose. LFTs were normal. - 3. Hypertension Stop clonidine so that we can replace this with an ACE inhibitor which we are  starting benazepril 10 mg daily. He will return to clinic in one to 2 weeks to check BMET.  4. Screening for colorectal cancer At prior office visit we discussed the fact that his last colonoscopy was 10 years ago. He told me he just received a letter informing him to schedule followup.  At [place] 10/2013 I  asked him about this and he said he called his insurance and they were going to cover 80% but not the full cost. He stated that he cannot afford this. I discussed getting Hemoccults and he agreed with doing this. He did turn in  his Hemoccult x3 and they were negative x3.  5. prostate cancer screening: No further screening indicated as he is now age 65.  31. Immunizations:  Influenza vaccine:He has had for this year Tetanus: This was updated at our visit 07/25/13 Pneumonia vaccine: He was given Pneumovax 23 at a visit 07/25/13. AT LEAST 12 MONTHS SHOULD LAPSE, THEN GIVE PREVNAR 13 Zostavax: Discussed this at the last visit. He says that he checked and his cost is going to be $95 and he defers.    ORDERS PALED TODAY:  1. Diabetes mellitus without complication - Hemoglobin A1c - aspirin 81 MG tablet; Take 1 tablet (81 mg total) by mouth daily.  Dispense: 30 tablet; Refill: 11 - benazepril (LOTENSIN) 10 MG tablet; Take 1 tablet (10 mg total) by mouth daily.  Dispense: 30 tablet; Refill: 3 - BASIC METABOLIC PANEL WITH GFR; Future  2. Hyperlipidemia  3. Hypertension - benazepril (LOTENSIN) 10 MG tablet; Take 1 tablet (10 mg total) by mouth daily.  Dispense: 30 tablet; Refill: 3 - BASIC METABOLIC PANEL WITH GFR; Future   Labs in 1-2 weeks.  Office visit 3 months or sooner if needed.  Marin Olp [place], [place], [facility] 02/10/2014 9:18 AM

## 2014-02-16 ENCOUNTER — Other Ambulatory Visit: Payer: Self-pay | Admitting: Physician Assistant

## 2014-02-17 NOTE — Telephone Encounter (Signed)
Medication refilled per protocol. 

## 2014-03-27 ENCOUNTER — Other Ambulatory Visit: Payer: Self-pay | Admitting: Physician Assistant

## 2014-03-27 DIAGNOSIS — I10 Essential (primary) hypertension: Secondary | ICD-10-CM

## 2014-03-27 DIAGNOSIS — E119 Type 2 diabetes mellitus without complications: Secondary | ICD-10-CM

## 2014-04-24 ENCOUNTER — Other Ambulatory Visit: Payer: Self-pay | Admitting: Physician Assistant

## 2014-04-24 NOTE — Telephone Encounter (Signed)
Pt not on 1000 mg Metformin.  Refill denied

## 2014-05-01 ENCOUNTER — Other Ambulatory Visit: Payer: Self-pay | Admitting: Physician Assistant

## 2014-05-02 NOTE — Telephone Encounter (Signed)
Refill appropriate and filled per protocol. 

## 2014-05-14 ENCOUNTER — Encounter: Payer: Self-pay | Admitting: Physician Assistant

## 2014-05-14 ENCOUNTER — Ambulatory Visit (INDEPENDENT_AMBULATORY_CARE_PROVIDER_SITE_OTHER): Payer: Medicare Other | Admitting: Physician Assistant

## 2014-05-14 VITALS — BP 118/74 | HR 76 | Temp 97.8°F | Resp 14 | Ht 71.0 in | Wt 175.0 lb

## 2014-05-14 DIAGNOSIS — E119 Type 2 diabetes mellitus without complications: Secondary | ICD-10-CM

## 2014-05-14 DIAGNOSIS — Z1212 Encounter for screening for malignant neoplasm of rectum: Secondary | ICD-10-CM

## 2014-05-14 DIAGNOSIS — I10 Essential (primary) hypertension: Secondary | ICD-10-CM

## 2014-05-14 DIAGNOSIS — Z1211 Encounter for screening for malignant neoplasm of colon: Secondary | ICD-10-CM

## 2014-05-14 DIAGNOSIS — E785 Hyperlipidemia, unspecified: Secondary | ICD-10-CM

## 2014-05-14 LAB — LIPID PANEL
CHOL/HDL RATIO: 2 ratio
Cholesterol: 123 mg/dL (ref 0–200)
HDL: 61 mg/dL (ref >39)
LDL Cholesterol: 48 mg/dL (ref 0–99)
TRIGLYCERIDES: 69 mg/dL (ref <150)
VLDL: 14 mg/dL (ref 0–40)

## 2014-05-14 LAB — COMPLETE METABOLIC PANEL WITH GFR
ALBUMIN: 4.6 g/dL (ref 3.5–5.2)
ALK PHOS: 66 U/L (ref 39–117)
ALT: 20 U/L (ref 0–53)
AST: 16 U/L (ref 0–37)
BILIRUBIN TOTAL: 0.9 mg/dL (ref 0.2–1.2)
BUN: 14 mg/dL (ref 6–23)
CO2: 26 meq/L (ref 19–32)
Calcium: 10 mg/dL (ref 8.4–10.5)
Chloride: 103 mEq/L (ref 96–112)
Creat: 1.12 mg/dL (ref 0.50–1.35)
GFR, Est African American: 74 mL/min
GFR, Est Non African American: 64 mL/min
Glucose, Bld: 147 mg/dL — ABNORMAL HIGH (ref 70–99)
POTASSIUM: 4.4 meq/L (ref 3.5–5.3)
Sodium: 139 mEq/L (ref 135–145)
TOTAL PROTEIN: 7.3 g/dL (ref 6.0–8.3)

## 2014-05-14 LAB — HEMOGLOBIN A1C
Hgb A1c MFr Bld: 6.4 % — ABNORMAL HIGH (ref <5.7)
Mean Plasma Glucose: 137 mg/dL — ABNORMAL HIGH (ref <117)

## 2014-05-14 LAB — MICROALBUMIN, URINE: MICROALB UR: 0.5 mg/dL (ref 0.00–1.89)

## 2014-05-14 NOTE — Progress Notes (Signed)
Patient ID: Reagan Klemz MRN: [id number], DOB: 06-03-1941, 73 y.o. Date of Encounter: @DATE @  Chief Complaint:  Chief Complaint  Patient presents with  . Med check    Pt fasting  . Ringining in ears and dizziness    HPI: 73 y.o. year old AA male  presents for above.  I saw him as a new patient to our office on 04/24/13. At that time he reported that his prior PCP, who was in [place], had retired 3 months prior. He reported that his last visit there was April 2014. He reported that his wife, [name], recently became a patient at this office and had been pleased. She recommended that he come to our office also since his PCP retired. 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 Played softball until age 78!!! 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   At prior visit he stated he was walking 40 minutes twice a day. He does this walking with his wife. At [place] in January 2015-- he  said that b/c of the weather, they are now doing this once a day around 2 PM. He is still also doing his elliptical and bicycle for 15 minutes per day and uses a rowing machine. All does exercise 5 days per week.  He checks his blood pressure every day and gets good readings. He checks his blood sugar every morning and gets between 90-120. He did not bring in the log sheet today.  At prior lab we have told him to cut his Lipitor dose in half and he says that he did this. At lab 10/2013 told him to take 1/2 on MOn, Wed, Fri only--says he is doing this.  As well at last lab we have told him to cut his metformin down in half from 1000 twice a day to 500 twice a day. He also reports that he did make this change.   His last routine 3 month followup office visit with me was 01/27/14. At that visit we discussed the fact that he was on no ACE inhibitor. Explained to him that  ACE inhibitor would protect his kidneys from the effects of diabetes over time. Recommended that we stop the  clonidine and replace it with benazepril 10 mg daily. Patient was agreeable to make that change. He had followup office visit with me 5/15 to recheck blood pressure and BMET after medicine change. As well he always checks his blood pressures that had him anyway. Today he reports that he has continued to get good readings similar to our reading today.  Also at last office visit I added aspirin 81 mg daily. He states that he did add this medication and is taking it as directed.  Taking  all medications as directed with no adverse effects.  Today he does report some ringing in his left ear. Says that he has been using over-the-counter wax remover. No other complaints today.   Past Medical History  Diagnosis Date  . Diabetes mellitus without complication   . Hyperlipidemia   . Hypertension      Home Meds:  Outpatient Prescriptions Prior to Visit  Medication Sig Dispense Refill  . aspirin 81 MG tablet Take 1 tablet (81 mg total) by mouth daily.  30 tablet  11  . atorvastatin (LIPITOR) 10 MG tablet Take 1 tablet (10 mg total) by mouth at bedtime.  30 tablet  5  . benazepril (LOTENSIN) 10 MG tablet Take 1  tablet (10 mg total) by mouth daily.  30 tablet  5  . cetirizine (ZYRTEC) 10 MG tablet Take 10 mg by mouth daily.      Marland Kitchen FREESTYLE TEST STRIPS test strip       . Lancets (FREESTYLE) lancets 1 each by Other route daily. Use as instructed      . latanoprost (XALATAN) 0.005 % ophthalmic solution Place 1 drop into the left eye at bedtime.      . Multiple Vitamins-Minerals (MULTIVITAMIN PO) Take by mouth.      . triamterene-hydrochlorothiazide (MAXZIDE-25) 37.5-25 MG per tablet TAKE ONE TABLET BY MOUTH ONCE DAILY  30 tablet  2  . metFORMIN (GLUCOPHAGE) 1000 MG tablet TAKE ONE TABLET BY MOUTH TWICE DAILY WITH MEALS  60 tablet  0   No facility-administered medications prior to visit.     Allergies: No Known Allergies  History   Social History  . Marital Status: Married    Spouse Name:  N/A    Number of Children: N/A  . Years of Education: N/A   Occupational History  . Not on file.   Social History Main Topics  . Smoking status: Former Smoker    Quit date: 04/24/1966  . Smokeless tobacco: Never Used  . Alcohol Use: Yes  . Drug Use: No  . Sexual Activity: Yes    Birth Control/ Protection: None   Other Topics Concern  . Not on file   Social History Narrative  . No narrative on file    Family History  Problem Relation Age of Onset  . Cancer Mother     ovarian  . Hypertension Father   . Stroke Father   . Diabetes Brother   . Heart disease Brother      Review of Systems:  See HPI for pertinent ROS. All other ROS negative.    Physical Exam: Blood pressure 118/74, pulse 76, temperature 97.8 F (36.6 C), temperature source Oral, resp. rate 14, height 5\' 11"  (1.803 m), weight 175 lb (79.379 kg)., Body mass index is 24.42 kg/(m^2). General: WNWD AAM. Appears in no acute distress. Head: Normocephalic, atraumatic, eyes without discharge, sclera non-icteric, nares are without discharge. Bilateral auditory canals clear, TM's are without perforation, pearly grey and translucent with reflective cone of light bilaterally. Oral cavity moist, posterior pharynx without exudate, erythema, peritonsillar abscess. No tenderness of the sinuses with percussion. Neck: Supple. No thyromegaly. No lymphadenopathy. No carotid bruits. Lungs: Clear bilaterally to auscultation without wheezes, rales, or rhonchi. Breathing is unlabored. Heart: RRR with S1 S2. No murmurs, rubs, or gallops. Abdomen: Soft, non-tender, non-distended with normoactive bowel sounds. No hepatomegaly. No rebound/guarding. No obvious abdominal masses. Musculoskeletal:  Strength and tone normal for age. Extremities/Skin: Warm and dry. No clubbing or cyanosis. No edema. No rashes or suspicious lesions. Neuro: Alert and oriented X 3. Moves all extremities spontaneously. Gait is normal. CNII-XII grossly in  tact. Psych:  Responds to questions appropriately with a normal affect. Diabetic foot exam: Inspection is normal. No other wounds and no calluses or concerning areas. Is intact throughout. He has 2+ dorsalis pedis and posterior tibial pulses bilaterally.      ASSESSMENT AND PLAN:  73 y.o. year old male with   1. Diabetes mellitus without complication  - Hemoglobin A1c  At [facility] he reported he had not had an eye exam in almost 15 years. We had discussed need for diabetic eye exam at prior OV. At [facility] 01/27/14  he reported that he does have appt scheduled--for May  5th. THIS IS THE ONLY SECTION OF THE 01/27/14 OV NOTE THAT WAS UPDATED/ADDRESSED AT VISIT 02/10/2014. TODAY--02/10/14--HE REPORTS THAT HE DID SEE EYE DOCTOR. Has Glaucoma Left Eye. Has Cataract Right Eye. Also says he is getting glasses.  He is on statin. He is now on ACE Inhibitor-- benazepril 10mg  QD.  Was on no aspirin so I have added aspirin 81 mg daily at [place] 01/2014. He has no contraindication to this.Today he reports he is taking ASA 81 mg QD. Last microalbumin 04/2013.--Repeat now  2. Hyperlipidemia He had lipid panel and LFTs at last visit. Lipids excellent and we actually had him decrease his Lipitor to half of a pill on Monday Wednesday Friday only and he is taking this dose. LFTs were normal. Recheck CMET, FLP now - 3. Hypertension He is now on ACE inhibitor. Blood Pressure at goal. -CMET  4. Screening for colorectal cancer At prior office visit we discussed the fact that his last colonoscopy was 10 years ago. He told me he just received a letter informing him to schedule followup.  At [place] 10/2013 I  asked him about this and he said he called his insurance and they were going to cover 80% but not the full cost. He stated that he cannot afford this. I discussed getting Hemoccults and he agreed with doing this. He did turn in  his Hemoccult x3 and they were negative x3.  5. prostate cancer screening: No further screening  indicated as he is now age 58.  55. Immunizations:  Influenza vaccine:He has had for this year Tetanus: This was updated at our visit 07/25/13 Pneumonia vaccine: He was given Pneumovax 23 at a visit 07/25/13. AT LEAST 12 MONTHS SHOULD LAPSE, THEN GIVE PREVNAR 13 Zostavax: Discussed this at the last visit. He says that he checked and his cost is going to be $95 and he defers.  7. Bilateral Cerumen Impaction: Both ears irrigated today.  Routine Office visit 3 months or sooner if needed.  Marin Olp [place], [place], [facility] 05/14/2014 8:47 AM

## 2014-06-19 ENCOUNTER — Other Ambulatory Visit: Payer: Self-pay | Admitting: Physician Assistant

## 2014-07-21 ENCOUNTER — Other Ambulatory Visit: Payer: Self-pay | Admitting: Physician Assistant

## 2014-07-21 NOTE — Telephone Encounter (Signed)
Medication refilled per protocol. 

## 2014-07-29 ENCOUNTER — Other Ambulatory Visit: Payer: Self-pay | Admitting: Physician Assistant

## 2014-07-30 NOTE — Telephone Encounter (Signed)
Medication refilled per protocol. 

## 2014-08-14 ENCOUNTER — Encounter: Payer: Self-pay | Admitting: Physician Assistant

## 2014-08-14 ENCOUNTER — Ambulatory Visit (INDEPENDENT_AMBULATORY_CARE_PROVIDER_SITE_OTHER): Payer: Medicare Other | Admitting: Physician Assistant

## 2014-08-14 VITALS — BP 130/86 | HR 68 | Temp 97.8°F | Resp 18 | Wt 172.0 lb

## 2014-08-14 DIAGNOSIS — Z1212 Encounter for screening for malignant neoplasm of rectum: Secondary | ICD-10-CM

## 2014-08-14 DIAGNOSIS — E119 Type 2 diabetes mellitus without complications: Secondary | ICD-10-CM

## 2014-08-14 DIAGNOSIS — J069 Acute upper respiratory infection, unspecified: Secondary | ICD-10-CM

## 2014-08-14 DIAGNOSIS — I10 Essential (primary) hypertension: Secondary | ICD-10-CM

## 2014-08-14 DIAGNOSIS — Z1211 Encounter for screening for malignant neoplasm of colon: Secondary | ICD-10-CM

## 2014-08-14 DIAGNOSIS — Z23 Encounter for immunization: Secondary | ICD-10-CM

## 2014-08-14 DIAGNOSIS — B9689 Other specified bacterial agents as the cause of diseases classified elsewhere: Secondary | ICD-10-CM

## 2014-08-14 DIAGNOSIS — E785 Hyperlipidemia, unspecified: Secondary | ICD-10-CM

## 2014-08-14 LAB — HEMOGLOBIN A1C, FINGERSTICK: Hgb A1C (fingerstick): 6.2 % — ABNORMAL HIGH (ref <5.7)

## 2014-08-14 MED ORDER — AMOXICILLIN-POT CLAVULANATE 875-125 MG PO TABS: 1.0000 | ORAL_TABLET | Freq: Two times a day (BID) | ORAL | Status: DC

## 2014-08-14 NOTE — Progress Notes (Signed)
Patient ID: Aaron Mcfarland MRN: [id number], DOB: 1941-08-16, 73 y.o. Date of Encounter: @DATE @  Chief Complaint:  Chief Complaint  Patient presents with  . 73 mth check up    is fasting, c/o head congestion    HPI: 73 y.o. year old AA male  presents for above.  I saw him as a new patient to our office on 04/24/13. At that time he reported that his prior PCP, who was in [place], had retired 3 months prior. He reported that his last visit there was April 2014. He reported that his wife, Aaron Mcfarland, recently became a patient at this office and had been pleased. She recommended that he come to our office also since his PCP retired. 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 Played softball until age 57!!! 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  Depending on the weather--he either --- walks 40 minutes twice a day. He does this walking with his with his wife----OR------he and his wife walk once a day around 2 PM. Then he also does elliptical and bicycle for 15 minutes per day and uses a rowing machine.  Does exercise 5 days per week.  He checks his blood pressure every day and gets good readings. He was checking his blood sugar every morning and was getting between 90-120. I told him he did not have to cont to check every day--he has decreased to checking twice a week--gets around 110-130.   At prior lab we have told him to cut his Lipitor dose in half ---and only take Mon/Wed/Fri--- he says that he is taking this with these directions.    As well at last lab we have told him to cut his metformin down in half from 1000 twice a day to 500 twice a day. He also reports that he did make this change.  Also at last office visit I added aspirin 81 mg daily. He states that he did add this medication and is taking it as directed.  Taking  all medications as directed with no adverse effects.  Today he does report  That he has had thick mucus from his nose for  past 3 weeks.  Has some cough secondary to post nasal drip but no chest congestion. No ST. No F/C.  No other complaints today.   Past Medical History  Diagnosis Date  . Diabetes mellitus without complication   . Hyperlipidemia   . Hypertension      Home Meds:  Outpatient Prescriptions Prior to Visit  Medication Sig Dispense Refill  . aspirin 81 MG tablet Take 1 tablet (81 mg total) by mouth daily. 30 tablet 11  . atorvastatin (LIPITOR) 10 MG tablet TAKE ONE TABLET BY MOUTH ONCE DAILY AT BEDTIME 30 tablet 0  . benazepril (LOTENSIN) 10 MG tablet Take 1 tablet (10 mg total) by mouth daily. 30 tablet 5  . cetirizine (ZYRTEC) 10 MG tablet Take 10 mg by mouth daily.    Marland Kitchen FREESTYLE TEST STRIPS test strip     . Lancets (FREESTYLE) lancets 1 each by Other route daily. Use as instructed    . latanoprost (XALATAN) 0.005 % ophthalmic solution Place 1 drop into the left eye at bedtime.    . metFORMIN (GLUCOPHAGE) 1000 MG tablet TAKE ONE HALF TABLET BY MOUTH TWICE DAILY WITH MEALS    . Multiple Vitamins-Minerals (MULTIVITAMIN PO) Take by mouth.    . triamterene-hydrochlorothiazide (MAXZIDE-25) 37.5-25 MG per tablet TAKE ONE  TABLET BY MOUTH ONCE DAILY 30 tablet 0  . metFORMIN (GLUCOPHAGE) 1000 MG tablet TAKE ONE TABLET BY MOUTH TWICE DAILY WITH MEALS 60 tablet 0   No facility-administered medications prior to visit.     Allergies: No Known Allergies  History   Social History  . Marital Status: Married    Spouse Name: N/A    Number of Children: N/A  . Years of Education: N/A   Occupational History  . Not on file.   Social History Main Topics  . Smoking status: Former Smoker    Quit date: 04/24/1966  . Smokeless tobacco: Never Used  . Alcohol Use: Yes  . Drug Use: No  . Sexual Activity: Yes    Birth Control/ Protection: None   Other Topics Concern  . Not on file   Social History Narrative    Family History  Problem Relation Age of Onset  . Cancer Mother     ovarian  .  Hypertension Father   . Stroke Father   . Diabetes Brother   . Heart disease Brother      Review of Systems:  See HPI for pertinent ROS. All other ROS negative.    Physical Exam: Blood pressure 130/86, pulse 68, temperature 97.8 F (36.6 C), temperature source Oral, resp. rate 18, weight 172 lb (78.019 kg)., Body mass index is 24 kg/(m^2). General: WNWD AAM. Appears in no acute distress. Head: Normocephalic, atraumatic, eyes without discharge, sclera non-icteric, nares are without discharge. Bilateral auditory canals clear, TM's are without perforation, pearly grey and translucent with reflective cone of light bilaterally. Oral cavity moist, posterior pharynx without exudate, erythema, peritonsillar abscess. No tenderness of the sinuses with percussion. Neck: Supple. No thyromegaly. No lymphadenopathy. No carotid bruits. Lungs: Clear bilaterally to auscultation without wheezes, rales, or rhonchi. Breathing is unlabored. Heart: RRR with S1 S2. No murmurs, rubs, or gallops. Abdomen: Soft, non-tender, non-distended with normoactive bowel sounds. No hepatomegaly. No rebound/guarding. No obvious abdominal masses. Musculoskeletal:  Strength and tone normal for age. Extremities/Skin: Warm and dry. No clubbing or cyanosis. No edema. No rashes or suspicious lesions. Neuro: Alert and oriented X 3. Moves all extremities spontaneously. Gait is normal. CNII-XII grossly in tact. Psych:  Responds to questions appropriately with a normal affect. Diabetic foot exam: Inspection is normal. No other wounds and no calluses or concerning areas. Is intact throughout. He has 2+ dorsalis pedis and posterior tibial pulses bilaterally.      ASSESSMENT AND PLAN:  73 y.o. year old male with   1. Diabetes mellitus without complication  - Hemoglobin A1c  MicroAlbumin----Done 05/14/2014  At prior OV he reported he had not had an eye exam in almost 15 years. We had discussed need for diabetic eye exam.   at prior  OV.  At [place] --02/10/14--He reported that he did see Eye Doctor.  Has Glaucoma Left Eye. Has Cataract Right Eye. Also says he is getting glasses.  He is on statin. He is now on ACE Inhibitor-- benazepril 10mg  QD.  He is now on Aspirin 81mg .     2. Hyperlipidemia Lipids excellent and we actually had him decrease his Lipitor to half of a pill on Monday Wednesday Friday only and he is taking this dose. Had FLP/LFTs  05/14/2014  3. Hypertension He is now on ACE inhibitor. Blood Pressure at goal. CMET--nml 05/2014  4. Screening for colorectal cancer At prior office visit we discussed the fact that his last colonoscopy was 10 years ago.  At [place] 10/2013 I  asked him about this and he said he called his insurance and they were going to cover 80% but not the full cost. He stated that he cannot afford this. He was agreeable to do Hemoccults ---He did turn in  his Hemoccult x3 ---- they were negative x3.  5. prostate cancer screening: No further screening indicated as he is now age 48.  16. Immunizations:  Influenza vaccine:He has had for this year Tetanus: This was updated at our visit 07/25/13 Pneumonia vaccine: He was given Pneumovax 23 here-- 07/25/13.     PREVNAR 13---given here 08/14/2014--pt agreeable Zostavax: Discussed this at the last visit. He says that he checked and his cost is going to be $95 and he defers.    Bacterial upper respiratory infection - amoxicillin-clavulanate (AUGMENTIN) 875-125 MG per tablet; Take 1 tablet by mouth 2 (two) times daily.  Dispense: 20 tablet; Refill: 0  6. Need for prophylactic vaccination against Streptococcus pneumoniae (pneumococcus) - Pneumococcal conjugate vaccine 13-valent     Routine Office visit 3 months or sooner if needed.  [address] [place], [place], [facility] 08/14/2014 9:19 AM

## 2014-08-20 ENCOUNTER — Other Ambulatory Visit: Payer: Self-pay | Admitting: Physician Assistant

## 2014-08-21 NOTE — Telephone Encounter (Signed)
Medication refilled per protocol. 

## 2014-08-27 ENCOUNTER — Other Ambulatory Visit: Payer: Self-pay | Admitting: Physician Assistant

## 2014-08-27 NOTE — Telephone Encounter (Signed)
Medication refilled per protocol. 

## 2014-09-29 ENCOUNTER — Other Ambulatory Visit: Payer: Self-pay | Admitting: Physician Assistant

## 2014-09-29 NOTE — Telephone Encounter (Signed)
Medication refilled per protocol. 

## 2014-10-06 LAB — HM DIABETES EYE EXAM

## 2014-10-16 DIAGNOSIS — H2513 Age-related nuclear cataract, bilateral: Secondary | ICD-10-CM | POA: Diagnosis not present

## 2014-10-16 DIAGNOSIS — H47233 Glaucomatous optic atrophy, bilateral: Secondary | ICD-10-CM | POA: Diagnosis not present

## 2014-11-03 DIAGNOSIS — H2513 Age-related nuclear cataract, bilateral: Secondary | ICD-10-CM | POA: Diagnosis not present

## 2014-11-03 DIAGNOSIS — H269 Unspecified cataract: Secondary | ICD-10-CM | POA: Diagnosis not present

## 2014-11-03 DIAGNOSIS — H25811 Combined forms of age-related cataract, right eye: Secondary | ICD-10-CM | POA: Diagnosis not present

## 2014-11-17 ENCOUNTER — Other Ambulatory Visit: Payer: Self-pay | Admitting: Physician Assistant

## 2014-11-18 NOTE — Telephone Encounter (Signed)
Medication refilled per protocol. 

## 2014-11-20 ENCOUNTER — Ambulatory Visit (INDEPENDENT_AMBULATORY_CARE_PROVIDER_SITE_OTHER): Payer: Medicare Other | Admitting: Physician Assistant

## 2014-11-20 ENCOUNTER — Encounter: Payer: Self-pay | Admitting: Family Medicine

## 2014-11-20 ENCOUNTER — Encounter: Payer: Self-pay | Admitting: Physician Assistant

## 2014-11-20 VITALS — BP 134/78 | HR 84 | Temp 97.9°F | Resp 18 | Wt 182.0 lb

## 2014-11-20 DIAGNOSIS — Z1211 Encounter for screening for malignant neoplasm of colon: Secondary | ICD-10-CM | POA: Diagnosis not present

## 2014-11-20 DIAGNOSIS — E785 Hyperlipidemia, unspecified: Secondary | ICD-10-CM

## 2014-11-20 DIAGNOSIS — E119 Type 2 diabetes mellitus without complications: Secondary | ICD-10-CM | POA: Diagnosis not present

## 2014-11-20 DIAGNOSIS — I10 Essential (primary) hypertension: Secondary | ICD-10-CM

## 2014-11-20 DIAGNOSIS — Z1212 Encounter for screening for malignant neoplasm of rectum: Secondary | ICD-10-CM

## 2014-11-20 DIAGNOSIS — K219 Gastro-esophageal reflux disease without esophagitis: Secondary | ICD-10-CM | POA: Diagnosis not present

## 2014-11-20 LAB — COMPLETE METABOLIC PANEL WITH GFR
ALBUMIN: 4.5 g/dL (ref 3.5–5.2)
ALT: 22 U/L (ref 0–53)
AST: 16 U/L (ref 0–37)
Alkaline Phosphatase: 68 U/L (ref 39–117)
BUN: 16 mg/dL (ref 6–23)
CO2: 19 mEq/L (ref 19–32)
Calcium: 10 mg/dL (ref 8.4–10.5)
Chloride: 104 mEq/L (ref 96–112)
Creat: 1.11 mg/dL (ref 0.50–1.35)
GFR, Est African American: 75 mL/min
GFR, Est Non African American: 65 mL/min
Glucose, Bld: 87 mg/dL (ref 70–99)
Potassium: 4.5 mEq/L (ref 3.5–5.3)
SODIUM: 141 meq/L (ref 135–145)
Total Bilirubin: 0.6 mg/dL (ref 0.2–1.2)
Total Protein: 7.4 g/dL (ref 6.0–8.3)

## 2014-11-20 LAB — LIPID PANEL
Cholesterol: 123 mg/dL (ref 0–200)
HDL: 56 mg/dL (ref >39)
LDL Cholesterol: 36 mg/dL (ref 0–99)
Total CHOL/HDL Ratio: 2.2 Ratio
Triglycerides: 153 mg/dL — ABNORMAL HIGH (ref <150)
VLDL: 31 mg/dL (ref 0–40)

## 2014-11-20 LAB — HEMOGLOBIN A1C
Hgb A1c MFr Bld: 6.6 % — ABNORMAL HIGH (ref <5.7)
Mean Plasma Glucose: 143 mg/dL — ABNORMAL HIGH (ref <117)

## 2014-11-20 NOTE — Progress Notes (Signed)
Patient ID: Kijana Cromie MRN: [id number], DOB: Jul 17, 1941, 74 y.o. Date of Encounter: @DATE @  Chief Complaint:  Chief Complaint  Patient presents with  . 3 mth checkup    is fasting, just had cataract surgery.Marland Kitchenon eye drops for one more week  . c/o sore throat x 1 mth    worse at night feels burning in throat    HPI: 74 y.o. year old AA male  presents for above.  I saw him as a new patient to our office on 04/24/13. At that time he reported that his prior PCP, who was in [place], had retired 3 months prior. He reported that his last visit there was April 2014. He reported that his wife, [name], recently became a patient at this office and had been pleased. She recommended that he come to our office also since his PCP retired. He worked at [employer]. Played softball until age 78!!! 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  Depending on the weather--he either --- walks 40 minutes twice a day. He does this walking with his with his wife----OR------he and his wife walk once a day around 2 PM. Then he also does elliptical and bicycle for 15 minutes per day and uses a rowing machine.  Does exercise 5 days per week.  He checks his blood pressure every day and gets good readings. He was checking his blood sugar every morning and was getting between 90-120. I told him he did not have to cont to check every day--he has decreased to checking twice a week--gets around 110-130.   At prior lab we have told him to cut his Lipitor dose in half ---and only take Mon/Wed/Fri--- he says that he is taking this with these directions.    As well at last lab we have told him to cut his metformin down in half from 1000 twice a day to 500 twice a day. He also reports that he did make this change.  Also at last office visit I added aspirin 81 mg daily. He states that he did add this medication and is taking it as directed.  Taking  all medications as  directed with no adverse effects.  Today he does report that at night he can feel acid come up in his throat.  Never happens during the day. Only at night.  Eats "dinner "  At 6 pm.  Eats a sandwich at 10:00. Goes to bed at midnight.  When in bed, feels this acid. Often he is woken several hours after going to bed with acid coming up in his throat.     No other complaints today.   Past Medical History  Diagnosis Date  . Diabetes mellitus without complication   . Hyperlipidemia   . Hypertension      Home Meds:  Outpatient Prescriptions Prior to Visit  Medication Sig Dispense Refill  . aspirin 81 MG tablet Take 1 tablet (81 mg total) by mouth daily. 30 tablet 11  . atorvastatin (LIPITOR) 10 MG tablet TAKE ONE TABLET BY MOUTH ONCE DAILY AT BEDTIME 30 tablet 0  . benazepril (LOTENSIN) 10 MG tablet Take 1 tablet (10 mg total) by mouth daily. 30 tablet 5  . cetirizine (ZYRTEC) 10 MG tablet Take 10 mg by mouth daily.    Marland Kitchen FREESTYLE TEST STRIPS test strip     . Lancets (FREESTYLE) lancets 1 each by Other route daily. Use as instructed    .  latanoprost (XALATAN) 0.005 % ophthalmic solution Place 1 drop into the left eye at bedtime.    . metFORMIN (GLUCOPHAGE) 1000 MG tablet TAKE ONE TABLET BY MOUTH TWICE DAILY WITH MEALS 60 tablet 3  . Multiple Vitamins-Minerals (MULTIVITAMIN PO) Take by mouth.    . triamterene-hydrochlorothiazide (MAXZIDE-25) 37.5-25 MG per tablet TAKE ONE TABLET BY MOUTH ONCE DAILY 30 tablet 3  . amoxicillin-clavulanate (AUGMENTIN) 875-125 MG per tablet Take 1 tablet by mouth 2 (two) times daily. 20 tablet 0   No facility-administered medications prior to visit.     Allergies: No Known Allergies  History   Social History  . Marital Status: Married    Spouse Name: N/A  . Number of Children: N/A  . Years of Education: N/A   Occupational History  . Not on file.   Social History Main Topics  . Smoking status: Former Smoker    Quit date: 04/24/1966  .  Smokeless tobacco: Never Used  . Alcohol Use: Yes  . Drug Use: No  . Sexual Activity: Yes    Birth Control/ Protection: None   Other Topics Concern  . Not on file   Social History Narrative    Family History  Problem Relation Age of Onset  . Cancer Mother     ovarian  . Hypertension Father   . Stroke Father   . Diabetes Brother   . Heart disease Brother      Review of Systems:  See HPI for pertinent ROS. All other ROS negative.    Physical Exam: Blood pressure 134/78, pulse 84, temperature 97.9 F (36.6 C), temperature source Oral, resp. rate 18, weight 182 lb (82.555 kg)., Body mass index is 25.4 kg/(m^2). General: WNWD AAM. Appears in no acute distress. Neck: Supple. No thyromegaly. No lymphadenopathy. No carotid bruits. Lungs: Clear bilaterally to auscultation without wheezes, rales, or rhonchi. Breathing is unlabored. Heart: RRR with S1 S2. No murmurs, rubs, or gallops. Abdomen: Soft, non-tender, non-distended with normoactive bowel sounds. No hepatomegaly. No rebound/guarding. No obvious abdominal masses. No tenderness with palpation, even in epigastric area.  Musculoskeletal:  Strength and tone normal for age. Extremities/Skin: Warm and dry. Neuro: Alert and oriented X 3. Moves all extremities spontaneously. Gait is normal. CNII-XII grossly in tact. Psych:  Responds to questions appropriately with a normal affect. Diabetic foot exam: Inspection is normal. No other wounds and no calluses or concerning areas. Is intact throughout. He has 2+ dorsalis pedis and posterior tibial pulses bilaterally.      ASSESSMENT AND PLAN:  74 y.o. year old male with   1. Diabetes mellitus without complication  - Hemoglobin A1c  MicroAlbumin----Done 05/14/2014  At prior OV he reported he had not had an eye exam in almost 15 years. We had discussed need for diabetic eye exam.   at prior OV.  At [place] --02/10/14--He reported that he did see Eye Doctor.  Has Glaucoma Left Eye. Has  Cataract Right Eye. Also says he is getting glasses. At [place] 2/16 reports had recent cataract surgery. Says last Eye Exam was 10/2014--Pt reports there was no diabetic retinopathy/ diabetic eye problems.   He is on statin. He is now on ACE Inhibitor-- benazepril 10mg  QD.  He is now on Aspirin 81mg .     2. Hyperlipidemia Lipids excellent and we actually had him decrease his Lipitor to half of a pill on Monday Wednesday Friday only and he is taking this dose. Had FLP/LFTs  05/14/2014  3. Hypertension He is now on ACE inhibitor. Blood  Pressure at goal. CMET--nml 05/2014  4. GERD He is to stop eating and drinking several hours prior to going to bed.  Call me if reflux does not resolve with this change.   5. Screening for colorectal cancer At prior office visit we discussed the fact that his last colonoscopy was 10 years ago.  At [place] 10/2013 I  asked him about this and he said he called his insurance and they were going to cover 80% but not the full cost. He stated that he cannot afford this. He was agreeable to do Hemoccults ---He did turn in  his Hemoccult x3 ---- they were negative x3.  6. Prostate Cancer Screening: No further screening indicated as he is now age 47.  29. Immunizations:  Influenza vaccine:He has had for this year Tetanus: This was updated at our visit 07/25/13 Pneumonia vaccine: He was given Pneumovax 23 here-- 07/25/13.     PREVNAR 13---given here 08/14/2014--pt agreeable Zostavax: Discussed this at the last visit. He says that he checked and his cost is going to be $95 and he defers.     Routine Office visit 3 months or sooner if needed.  Marin Olp [place], [place], [facility] 11/20/2014 8:17 AM

## 2015-01-16 ENCOUNTER — Other Ambulatory Visit: Payer: Self-pay | Admitting: Physician Assistant

## 2015-01-19 NOTE — Telephone Encounter (Signed)
Medication refilled per protocol. 

## 2015-02-23 ENCOUNTER — Ambulatory Visit (INDEPENDENT_AMBULATORY_CARE_PROVIDER_SITE_OTHER): Payer: Medicare Other | Admitting: Physician Assistant

## 2015-02-23 ENCOUNTER — Encounter: Payer: Self-pay | Admitting: Physician Assistant

## 2015-02-23 VITALS — BP 130/80 | HR 70 | Temp 97.7°F | Resp 19 | Wt 170.0 lb

## 2015-02-23 DIAGNOSIS — E119 Type 2 diabetes mellitus without complications: Secondary | ICD-10-CM

## 2015-02-23 DIAGNOSIS — Z1212 Encounter for screening for malignant neoplasm of rectum: Secondary | ICD-10-CM

## 2015-02-23 DIAGNOSIS — Z1211 Encounter for screening for malignant neoplasm of colon: Secondary | ICD-10-CM | POA: Diagnosis not present

## 2015-02-23 DIAGNOSIS — I10 Essential (primary) hypertension: Secondary | ICD-10-CM

## 2015-02-23 DIAGNOSIS — K219 Gastro-esophageal reflux disease without esophagitis: Secondary | ICD-10-CM | POA: Diagnosis not present

## 2015-02-23 DIAGNOSIS — E785 Hyperlipidemia, unspecified: Secondary | ICD-10-CM | POA: Diagnosis not present

## 2015-02-23 LAB — HEMOGLOBIN A1C, FINGERSTICK: Hgb A1C (fingerstick): 6.6 % — ABNORMAL HIGH (ref <5.7)

## 2015-02-23 MED ORDER — METFORMIN HCL 500 MG PO TABS: 500.0000 mg | ORAL_TABLET | Freq: Two times a day (BID) | ORAL | Status: DC

## 2015-02-23 NOTE — Progress Notes (Signed)
Patient ID: Kendry Pfarr MRN: [id number], DOB: 1941/06/07, 74 y.o. Date of Encounter: @DATE @  Chief Complaint:  Chief Complaint  Patient presents with  . 3 month f/u    HPI: 74 y.o. year old AA male  presents for above.  I saw him as a new patient to our office on 04/24/13. At that time he reported that his prior PCP, who was in [place], had retired 3 months prior. He reported that his last visit there was April 2014. He reported that his wife, [name], recently became a patient at this office and had been pleased. She recommended that he come to our office also since his PCP retired. He worked at [employer]. Played softball until age 25!!! 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  Depending on the weather--he either --- walks 40 minutes twice a day. He does this walking with his with his wife----OR------he and his wife walk once a day around 2 PM. Then he also does elliptical and bicycle for 15 minutes per day and uses a rowing machine.  Does exercise 5 days per week.  He checks his blood pressure every day and gets good readings. He was checking his blood sugar every morning and was getting between 90-120. I told him he did not have to cont to check every day--he has decreased to checking twice a week--gets around 110-130.   At prior lab we have told him to cut his Lipitor dose in half ---and only take Mon/Wed/Fri--- he says that he is taking this with these directions.    As well at last lab we have told him to cut his metformin down in half from 1000 twice a day to 500 twice a day. He also reports that he did make this change.  Also at last office visit I added aspirin 81 mg daily. He states that he did add this medication and is taking it as directed.  Taking  all medications as directed with no adverse effects.  At [place] 11/2014-- he reported that at night he felt acid come up in his throat.  Never happened during the day.  Only at night.  Was eaating "dinner "  At 6 pm.  Ate a sandwich at 10:00. Goes to bed at midnight.  When in bed, was feeling this acid. Often he was woken several hours after going to bed with acid coming up in his throat.  At [place] 02/2015 he reports he stopped eating the sandwich at 10pm. "I just eat enough at dinner that I'm not hungry later, now"---reflux has stopped.      No other complaints today.   Past Medical History  Diagnosis Date  . Diabetes mellitus without complication   . Hyperlipidemia   . Hypertension      Home Meds:  Outpatient Prescriptions Prior to Visit  Medication Sig Dispense Refill  . aspirin 81 MG tablet Take 1 tablet (81 mg total) by mouth daily. 30 tablet 11  . atorvastatin (LIPITOR) 10 MG tablet TAKE ONE TABLET BY MOUTH ONCE DAILY AT BEDTIME 30 tablet 0  . benazepril (LOTENSIN) 10 MG tablet Take 1 tablet (10 mg total) by mouth daily. 30 tablet 5  . cetirizine (ZYRTEC) 10 MG tablet Take 10 mg by mouth daily.    Marland Kitchen FREESTYLE TEST STRIPS test strip     . Lancets (FREESTYLE) lancets 1 each by Other route daily. Use as instructed    .  latanoprost (XALATAN) 0.005 % ophthalmic solution Place 1 drop into the left eye at bedtime.    . metFORMIN (GLUCOPHAGE) 1000 MG tablet TAKE ONE TABLET BY MOUTH TWICE DAILY WITH  MEALS 60 tablet 1  . Multiple Vitamins-Minerals (MULTIVITAMIN PO) Take by mouth.    . triamterene-hydrochlorothiazide (MAXZIDE-25) 37.5-25 MG per tablet TAKE ONE TABLET BY MOUTH ONCE DAILY 30 tablet 1   No facility-administered medications prior to visit.     Allergies: No Known Allergies  History   Social History  . Marital Status: Married    Spouse Name: N/A  . Number of Children: N/A  . Years of Education: N/A   Occupational History  . Not on file.   Social History Main Topics  . Smoking status: Former Smoker    Quit date: 04/24/1966  . Smokeless tobacco: Never Used  . Alcohol Use: Yes  . Drug Use: No  . Sexual Activity: Yes    Birth  Control/ Protection: None   Other Topics Concern  . Not on file   Social History Narrative    Family History  Problem Relation Age of Onset  . Cancer Mother     ovarian  . Hypertension Father   . Stroke Father   . Diabetes Brother   . Heart disease Brother      Review of Systems:  See HPI for pertinent ROS. All other ROS negative.    Physical Exam: Blood pressure 130/80, pulse 70, temperature 97.7 F (36.5 C), temperature source Oral, resp. rate 19, weight 170 lb (77.111 kg)., Body mass index is 23.72 kg/(m^2). General: WNWD AAM. Appears in no acute distress. Neck: Supple. No thyromegaly. No lymphadenopathy. No carotid bruits. Lungs: Clear bilaterally to auscultation without wheezes, rales, or rhonchi. Breathing is unlabored. Heart: RRR with S1 S2. No murmurs, rubs, or gallops. Abdomen: Soft, non-tender, non-distended with normoactive bowel sounds. No hepatomegaly. No rebound/guarding. No obvious abdominal masses. No tenderness with palpation, even in epigastric area.  Musculoskeletal:  Strength and tone normal for 74. Extremities/Skin: Warm and dry. Neuro: Alert and oriented X 3. Moves all extremities spontaneously. Gait is normal. CNII-XII grossly in tact. Psych:  Responds to questions appropriately with a normal affect. Diabetic foot exam: Inspection is normal. No other wounds and no calluses or concerning areas. Sensation intact throughout. He has 2+ dorsalis pedis and posterior tibial pulses bilaterally.      ASSESSMENT AND PLAN:  74 y.o. year old male with   1. Diabetes mellitus without complication  - Hemoglobin A1c  MicroAlbumin----Done 05/14/2014  At prior OV he reported he had not had an eye exam in almost 15 years. We had discussed need for diabetic eye exam.   at prior OV.  At [place] --02/10/14--He reported that he did see Eye Doctor.  Has Glaucoma Left Eye. Has Cataract Right Eye. Also says he is getting glasses. At [place] 2/16 reports had recent cataract  surgery. Says last Eye Exam was 10/2014--Pt reports there was no diabetic retinopathy/ diabetic eye problems.   He is on statin. He is now on ACE Inhibitor-- benazepril 10mg  QD.  He is now on Aspirin 81mg .     2. Hyperlipidemia Lipids excellent and we actually had him decrease his Lipitor to half of a pill on Monday Wednesday Friday only and he is taking this dose. Had FLP/LFT 11/2014--LDL 36. HDL 56----Excellent  3. Hypertension He is now on ACE inhibitor. Blood Pressure at goal. CMET--nml 11/2014  4. GERD At [place] 11/2014--He was to stop eating and drinking  several hours prior to going to bed.  At [place] 02/2015 he reports he did make this change and symptoms have resolved.   5. Screening for colorectal cancer At prior office visit we discussed the fact that his last colonoscopy was 10 years ago.  At [place] 10/2013 I  asked him about this and he said he called his insurance and they were going to cover 80% but not the full cost. He stated that he cannot afford this. He was agreeable to do Hemoccults ---He did turn in  his Hemoccult x3 ---- they were negative x3.  6. Prostate Cancer Screening: No further screening indicated as he is now age 57.  52. Immunizations:  Influenza vaccine:He has had for this year Tetanus: This was updated at our visit 07/25/13 Pneumonia vaccine: He was given Pneumovax 23 here-- 07/25/13.     PREVNAR 13---given here 08/14/2014--pt agreeable Zostavax: Discussed this at the last visit. He says that he checked and his cost is going to be $95 and he defers.     Routine Office visit 3 months or sooner if needed.  Marin Olp [place], [place], [facility] 02/23/2015 8:13 AM

## 2015-02-24 ENCOUNTER — Other Ambulatory Visit: Payer: Self-pay | Admitting: Family Medicine

## 2015-02-24 MED ORDER — GLUCOSE BLOOD VI STRP: 1.0000 | ORAL_STRIP | Freq: Every day | Status: DC

## 2015-02-24 MED ORDER — FREESTYLE LANCETS MISC: 1.0000 | Freq: Every day | Status: DC

## 2015-02-24 NOTE — Telephone Encounter (Signed)
Test strips/lancets refilled 

## 2015-03-23 ENCOUNTER — Encounter: Payer: Self-pay | Admitting: Physician Assistant

## 2015-03-30 ENCOUNTER — Other Ambulatory Visit: Payer: Self-pay | Admitting: Physician Assistant

## 2015-03-30 NOTE — Telephone Encounter (Signed)
Refill appropriate and filled per protocol. 

## 2015-04-09 DIAGNOSIS — H4011X1 Primary open-angle glaucoma, mild stage: Secondary | ICD-10-CM | POA: Diagnosis not present

## 2015-05-04 ENCOUNTER — Other Ambulatory Visit: Payer: Self-pay | Admitting: Physician Assistant

## 2015-05-04 NOTE — Telephone Encounter (Signed)
Medication refilled per protocol. 

## 2015-05-27 ENCOUNTER — Encounter: Payer: Medicare Other | Admitting: Physician Assistant

## 2015-06-01 ENCOUNTER — Encounter: Payer: Self-pay | Admitting: Family Medicine

## 2015-06-01 ENCOUNTER — Ambulatory Visit (INDEPENDENT_AMBULATORY_CARE_PROVIDER_SITE_OTHER): Payer: Medicare Other | Admitting: Physician Assistant

## 2015-06-01 ENCOUNTER — Encounter: Payer: Self-pay | Admitting: Physician Assistant

## 2015-06-01 ENCOUNTER — Other Ambulatory Visit: Payer: Self-pay | Admitting: Family Medicine

## 2015-06-01 VITALS — BP 128/72 | HR 70 | Temp 98.0°F | Resp 18 | Wt 181.0 lb

## 2015-06-01 DIAGNOSIS — E785 Hyperlipidemia, unspecified: Secondary | ICD-10-CM

## 2015-06-01 DIAGNOSIS — Z0000 Encounter for general adult medical examination without abnormal findings: Secondary | ICD-10-CM | POA: Diagnosis not present

## 2015-06-01 DIAGNOSIS — Z1211 Encounter for screening for malignant neoplasm of colon: Secondary | ICD-10-CM | POA: Diagnosis not present

## 2015-06-01 DIAGNOSIS — I10 Essential (primary) hypertension: Secondary | ICD-10-CM | POA: Diagnosis not present

## 2015-06-01 DIAGNOSIS — E119 Type 2 diabetes mellitus without complications: Secondary | ICD-10-CM

## 2015-06-01 DIAGNOSIS — Z79899 Other long term (current) drug therapy: Secondary | ICD-10-CM | POA: Diagnosis not present

## 2015-06-01 DIAGNOSIS — Z1212 Encounter for screening for malignant neoplasm of rectum: Secondary | ICD-10-CM

## 2015-06-01 LAB — CBC WITH DIFFERENTIAL/PLATELET
Basophils Absolute: 0 10*3/uL (ref 0.0–0.1)
Basophils Relative: 0 % (ref 0–1)
EOS ABS: 0.2 10*3/uL (ref 0.0–0.7)
Eosinophils Relative: 3 % (ref 0–5)
HCT: 39.4 % (ref 39.0–52.0)
Hemoglobin: 13.8 g/dL (ref 13.0–17.0)
LYMPHS ABS: 2.1 10*3/uL (ref 0.7–4.0)
LYMPHS PCT: 31 % (ref 12–46)
MCH: 33.3 pg (ref 26.0–34.0)
MCHC: 35 g/dL (ref 30.0–36.0)
MCV: 95.2 fL (ref 78.0–100.0)
MONOS PCT: 9 % (ref 3–12)
MPV: 9.4 fL (ref 8.6–12.4)
Monocytes Absolute: 0.6 10*3/uL (ref 0.1–1.0)
NEUTROS PCT: 57 % (ref 43–77)
Neutro Abs: 3.9 10*3/uL (ref 1.7–7.7)
Platelets: 248 10*3/uL (ref 150–400)
RBC: 4.14 MIL/uL — ABNORMAL LOW (ref 4.22–5.81)
RDW: 14.5 % (ref 11.5–15.5)
WBC: 6.9 10*3/uL (ref 4.0–10.5)

## 2015-06-01 LAB — LIPID PANEL
Cholesterol: 146 mg/dL (ref 125–200)
HDL: 66 mg/dL (ref >=40)
LDL CALC: 60 mg/dL (ref <130)
TRIGLYCERIDES: 98 mg/dL (ref <150)
Total CHOL/HDL Ratio: 2.2 Ratio (ref <=5.0)
VLDL: 20 mg/dL (ref <30)

## 2015-06-01 LAB — COMPLETE METABOLIC PANEL WITH GFR
ALBUMIN: 4.5 g/dL (ref 3.6–5.1)
ALK PHOS: 78 U/L (ref 40–115)
ALT: 24 U/L (ref 9–46)
AST: 19 U/L (ref 10–35)
BUN: 18 mg/dL (ref 7–25)
CO2: 24 mmol/L (ref 20–31)
Calcium: 10 mg/dL (ref 8.6–10.3)
Chloride: 103 mmol/L (ref 98–110)
Creat: 1.01 mg/dL (ref 0.70–1.18)
GFR, EST AFRICAN AMERICAN: 84 mL/min (ref >=60)
GFR, EST NON AFRICAN AMERICAN: 72 mL/min (ref >=60)
Glucose, Bld: 128 mg/dL — ABNORMAL HIGH (ref 70–99)
Potassium: 4.1 mmol/L (ref 3.5–5.3)
Sodium: 139 mmol/L (ref 135–146)
TOTAL PROTEIN: 7.4 g/dL (ref 6.1–8.1)
Total Bilirubin: 0.6 mg/dL (ref 0.2–1.2)

## 2015-06-01 LAB — TSH: TSH: 1.765 u[IU]/mL (ref 0.350–4.500)

## 2015-06-01 LAB — HM DIABETES FOOT EXAM

## 2015-06-01 MED ORDER — ONETOUCH ULTRA SYSTEM W/DEVICE KIT: 1.0000 | PACK | Freq: Once | Status: AC

## 2015-06-01 MED ORDER — ONETOUCH ULTRASOFT LANCETS MISC: Status: DC

## 2015-06-01 MED ORDER — GLUCOSE BLOOD VI STRP: ORAL_STRIP | Status: DC

## 2015-06-01 NOTE — Telephone Encounter (Signed)
Diabetic supplies to pharmacy

## 2015-06-01 NOTE — Progress Notes (Signed)
Patient ID: Aaron Mcfarland MRN: [id number], DOB: [birth year], 74 y.o. Date of Encounter: @DATE @  Chief Complaint:  Chief Complaint  Patient presents with  . Annual Exam    CPE    HPI: 74 y.o. year old AA male  presents for above.  I saw him as a new patient to our office on 04/24/13. At that time he reported that his prior PCP, who was in [place], had retired 3 months prior. He reported that his last visit there was April 2014. He reported that his wife, [name], recently became a patient at this office and had been pleased. She recommended that he come to our office also since his PCP retired. He worked at [employer]. Played softball until age 49!!! 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  Depending on the weather--he either --- walks 40 minutes twice a day. He does this walking with his with his wife----OR------he and his wife walk once a day around 2 PM. Then he also does elliptical and bicycle for 15 minutes per day and uses a rowing machine.  Does exercise 5 days per week.  He checks his blood pressure every day and gets good readings. He was checking his blood sugar every morning and was getting between 90-120. I told him he did not have to cont to check every day--he has decreased to checking twice a week--gets around 110-130.   At prior lab we have told him to cut his Lipitor dose in half ---and only take Mon/Wed/Fri--- he says that he is taking this with these directions.    As well at last lab we have told him to cut his metformin down in half from 1000 twice a day to 500 twice a day. He also reports that he did make this change.  Also at last office visit I added aspirin 81 mg daily. He states that he did add this medication and is taking it as directed.  Taking  all medications as directed with no adverse effects.  At [place] 11/2014-- he reported that at night he felt acid come up in his throat.  Never happened  during the day. Only at night.  Was eaating "dinner "  At 6 pm.  Ate a sandwich at 10:00. Goes to bed at midnight.  When in bed, was feeling this acid. Often he was woken several hours after going to bed with acid coming up in his throat.  At [place] 02/2015 he reports he stopped eating the sandwich at 10pm. "I just eat enough at dinner that I'm not hungry later, now"---reflux has stopped.      No complaints today.   Past Medical History  Diagnosis Date  . Diabetes mellitus without complication   . Hyperlipidemia   . Hypertension      Home Meds:  Outpatient Prescriptions Prior to Visit  Medication Sig Dispense Refill  . aspirin 81 MG tablet Take 1 tablet (81 mg total) by mouth daily. 30 tablet 11  . atorvastatin (LIPITOR) 10 MG tablet TAKE ONE TABLET BY MOUTH ONCE DAILY AT BEDTIME 30 tablet 0  . benazepril (LOTENSIN) 10 MG tablet TAKE ONE TABLET BY MOUTH ONCE DAILY 30 tablet 3  . cetirizine (ZYRTEC) 10 MG tablet Take 10 mg by mouth daily.    Marland Kitchen glucose blood (FREESTYLE TEST STRIPS) test strip 1 each by Other route daily before breakfast. (Patient not taking: Reported on 06/01/2015) 100 each 3  .  Lancets (FREESTYLE) lancets 1 each by Other route daily. Use as instructed (Patient not taking: Reported on 06/01/2015) 100 each 3  . latanoprost (XALATAN) 0.005 % ophthalmic solution Place 1 drop into the left eye at bedtime.    . metFORMIN (GLUCOPHAGE) 500 MG tablet Take 1 tablet (500 mg total) by mouth 2 (two) times daily with a meal. 180 tablet 3  . Multiple Vitamins-Minerals (MULTIVITAMIN PO) Take by mouth.    . triamterene-hydrochlorothiazide (MAXZIDE-25) 37.5-25 MG per tablet TAKE ONE TABLET BY MOUTH ONCE DAILY 30 tablet 0   No facility-administered medications prior to visit.     Allergies: No Known Allergies  Social History   Social History  . Marital Status: Married    Spouse Name: N/A  . Number of Children: N/A  . Years of Education: N/A   Occupational History  . Not on file.    Social History Main Topics  . Smoking status: Former Smoker    Quit date: 04/24/1966  . Smokeless tobacco: Never Used  . Alcohol Use: Yes  . Drug Use: No  . Sexual Activity: Yes    Birth Control/ Protection: None   Other Topics Concern  . Not on file   Social History Narrative    Family History  Problem Relation Age of Onset  . Cancer Mother     ovarian  . Hypertension Father   . Stroke Father   . Diabetes Brother   . Heart disease Brother   . Stroke Brother      Review of Systems:  See HPI for pertinent ROS. All other ROS negative.    Physical Exam: Blood pressure 128/72, pulse 70, temperature 98 F (36.7 C), temperature source Oral, resp. rate 18, weight 181 lb (82.101 kg)., Body mass index is 25.26 kg/(m^2). General: WNWD AAM. Appears in no acute distress. HEENT: Bilateral Eyes Normal. Bilateral Ears normal. Oral mucosa normal. Posterior Pharynx normal.  Neck: Supple. No thyromegaly. No lymphadenopathy. No carotid bruits. Lungs: Clear bilaterally to auscultation without wheezes, rales, or rhonchi. Breathing is unlabored. Heart: RRR with S1 S2. No murmurs, rubs, or gallops. Abdomen: Soft, non-tender, non-distended with normoactive bowel sounds. No hepatomegaly. No rebound/guarding. No obvious abdominal masses. No tenderness with palpation, even in epigastric area.  Musculoskeletal:  Strength and tone normal for age. Extremities/Skin: Warm and dry. Neuro: Alert and oriented X 3. Moves all extremities spontaneously. Gait is normal. CNII-XII grossly in tact. Psych:  Responds to questions appropriately with a normal affect. Diabetic foot exam: Inspection is normal. No other wounds and no calluses or concerning areas. Sensation intact throughout. He has 2+ dorsalis pedis and posterior tibial pulses bilaterally.      ASSESSMENT AND PLAN:  74 y.o. year old male with   1. Visit for preventive health examination - CBC with Differential/Platelet - COMPLETE METABOLIC  PANEL WITH GFR - Lipid panel - TSH - Vit D  25 hydroxy (rtn osteoporosis monitoring) . Screening for colorectal cancer - Fecal occult blood, imunochemical; Future - Fecal occult blood, imunochemical; Future - Fecal occult blood, imunochemical; Future   1. Diabetes mellitus without complication  - Hemoglobin A1c  MicroAlbumin----Done 06/01/2015  At prior OV he reported he had not had an eye exam in almost 15 years. We had discussed need for diabetic eye exam.   at prior OV.  At [place] --02/10/14--He reported that he did see Eye Doctor.  Has Glaucoma Left Eye. Has Cataract Right Eye. Also says he is getting glasses. At [place] 2/16 reports had recent cataract  surgery. Says last Eye Exam was 10/2014--Pt reports there was no diabetic retinopathy/ diabetic eye problems.    Diabetic Foot Exam: Documented in [facility] 06/01/2015  He is on statin. He is now on ACE Inhibitor-- benazepril 10mg  QD.  He is now on Aspirin 81mg .     2. Hyperlipidemia Lipids excellent and we actually had him decrease his Lipitor to half of a pill on Monday Wednesday Friday only and he is taking this dose. Had FLP/LFT 11/2014--LDL 36. HDL 56----Excellent  3. Hypertension He is now on ACE inhibitor. Blood Pressure at goal. CMET--nml 11/2014  4. GERD At [place] 11/2014--He was to stop eating and drinking several hours prior to going to bed.  At [place] 02/2015 he reports he did make this change and symptoms have resolved.   5. Screening for colorectal cancer At prior office visit we discussed the fact that his last colonoscopy was 10 years ago.  At [place] 10/2013 I  asked him about this and he said he called his insurance and they were going to cover 80% but not the full cost. He stated that he cannot afford this. He was agreeable to do Hemoccults ---He did turn in Hemoccult x3 ---- they were negative x3.---10/31/2013 06/01/2015--He is agreeable to do another set of HemeOccult Cards  6. Prostate Cancer Screening: No further  screening indicated as he is now age 36.  85. Immunizations:  Influenza vaccine: N/A--August---He did receive last year Tetanus: This was updated at our visit 07/25/13 Pneumonia vaccine: He was given Pneumovax 23 here-- 07/25/13.     PREVNAR 13---given here 08/14/2014 Zostavax: Discussed this at the last visit. He says that he checked and his cost is going to be $95 and he defers.  Routine Office visit 3 months or sooner if needed.   Subjective:   Patient presents for Medicare Annual/Subsequent preventive examination.   Review Past Medical/Family/Social:This is all reviewed, documented, updated today   Risk Factors  Current exercise habits:  See HPI Dietary issues discussed: See HPI. Compliant with diabetic, low cholesterol diet.  Cardiac risk factors: DM, HTN, HLD, Family history  Depression Screen  (Note: if answer to either of the following is "Yes", a more complete depression screening is indicated)  Over the past two weeks, have you felt down, depressed or hopeless? No Over the past two weeks, have you felt little interest or pleasure in doing things? No Have you lost interest or pleasure in daily life? No Do you often feel hopeless? No Do you cry easily over simple problems? No   Activities of Daily Living  In your present state of health, do you have any difficulty performing the following activities?:  Driving? No  Managing money? No  Feeding yourself? No  Getting from bed to chair? No  Climbing a flight of stairs? No  Preparing food and eating?: No  Bathing or showering? No  Getting dressed: No  Getting to the toilet? No  Using the toilet:No  Moving around from place to place: No  In the past year have you fallen or had a near fall?:No  Are you sexually active? No  Do you have more than one partner? No   Hearing Difficulties: No  Do you often ask people to speak up or repeat themselves? No  Do you experience ringing or noises in your ears? No Do you have  difficulty understanding soft or whispered voices? No  Do you feel that you have a problem with memory? No Do you often misplace items?  No  Do you feel safe at home? Yes  Cognitive Testing  Alert? Yes Normal Appearance?Yes  Oriented to person? Yes Place? Yes  Time? Yes  Recall of three objects? Yes  Can perform simple calculations? Yes  Displays appropriate judgment?Yes  Can read the correct time from a watch face?Yes   List the Names of Other Physician/Practitioners you currently use:  None  Indicate any recent Medical Services you may have received from other than [facility] providers in the past year (date may be approximate).  None Screening Tests / Date----THESE ARE ALL DOCUMENTED ABOVE Colonoscopy                     Zostavax  Mammogram  Influenza Vaccine  Tetanus/tdap    Assessment:    Annual wellness medicare exam   Plan:    During the course of the visit the patient was educated and counseled about appropriate screening and preventive services including:  Screening mammography  Colorectal cancer screening  Shingles vaccine. Prescription given to that she can get the vaccine at the pharmacy or Medicare part D.  Screen + for depression. PHQ- 9 score of 12 (moderate depression). We discussed the options of counseling versus possibly a medication. I encouraged her strongly think about the counseling. She is going through some medical problems currently and her husband is as well Mrs. been very stressful for her. She says she will think about it. She does have Xanax to use as needed. Though she may benefit from an SSRI for her more depressive type symptoms but she wants to hold off at this time.  I aksed her to please have her cardioloist send records since we have none on file.  Diet review for nutrition referral? Yes ____ Not Indicated __x__  Patient Instructions (the written plan) was given to the patient.  Medicare Attestation  I have personally reviewed:  The patient's  medical and social history  Their use of alcohol, tobacco or illicit drugs  Their current medications and supplements  The patient's functional ability including ADLs,fall risks, home safety risks, cognitive, and hearing and visual impairment  Diet and physical activities  Evidence for depression or mood disorders  The patient's weight, height, BMI, and visual acuity have been recorded in the chart. I have made referrals, counseling, and provided education to the patient based on review of the above and I have provided the patient with a written personalized care plan for preventive services.       Marin Olp [place], [place], [facility] 06/01/2015 8:33 AM

## 2015-06-02 DIAGNOSIS — Z1212 Encounter for screening for malignant neoplasm of rectum: Secondary | ICD-10-CM | POA: Diagnosis not present

## 2015-06-02 DIAGNOSIS — Z1211 Encounter for screening for malignant neoplasm of colon: Secondary | ICD-10-CM | POA: Diagnosis not present

## 2015-06-02 LAB — HEMOGLOBIN A1C
HEMOGLOBIN A1C: 6.8 % — AB (ref <5.7)
MEAN PLASMA GLUCOSE: 148 mg/dL — AB (ref <117)

## 2015-06-02 LAB — MICROALBUMIN, URINE: Microalb, Ur: 0.3 mg/dL (ref <2.0)

## 2015-06-02 LAB — VITAMIN D 25 HYDROXY (VIT D DEFICIENCY, FRACTURES): Vit D, 25-Hydroxy: 48 ng/mL (ref 30–100)

## 2015-06-03 DIAGNOSIS — Z1212 Encounter for screening for malignant neoplasm of rectum: Secondary | ICD-10-CM | POA: Diagnosis not present

## 2015-06-03 DIAGNOSIS — Z1211 Encounter for screening for malignant neoplasm of colon: Secondary | ICD-10-CM | POA: Diagnosis not present

## 2015-06-04 ENCOUNTER — Other Ambulatory Visit: Payer: Self-pay | Admitting: Physician Assistant

## 2015-06-04 NOTE — Telephone Encounter (Signed)
Refill appropriate and filled per protocol. 

## 2015-06-05 ENCOUNTER — Other Ambulatory Visit: Payer: Medicare Other

## 2015-06-05 ENCOUNTER — Encounter: Payer: Self-pay | Admitting: Family Medicine

## 2015-06-05 DIAGNOSIS — Z1212 Encounter for screening for malignant neoplasm of rectum: Principal | ICD-10-CM

## 2015-06-05 DIAGNOSIS — Z1211 Encounter for screening for malignant neoplasm of colon: Secondary | ICD-10-CM | POA: Diagnosis not present

## 2015-06-05 LAB — FECAL OCCULT BLOOD, IMMUNOCHEMICAL
FECAL OCCULT BLOOD: NEGATIVE
FECAL OCCULT BLOOD: NEGATIVE
Fecal Occult Blood: NEGATIVE

## 2015-06-10 ENCOUNTER — Encounter: Payer: Self-pay | Admitting: Family Medicine

## 2015-07-06 ENCOUNTER — Other Ambulatory Visit: Payer: Self-pay | Admitting: Physician Assistant

## 2015-07-06 NOTE — Telephone Encounter (Signed)
Medication refilled per protocol. 

## 2015-08-17 ENCOUNTER — Other Ambulatory Visit: Payer: Self-pay | Admitting: Family Medicine

## 2015-08-17 NOTE — Telephone Encounter (Signed)
Refill appropriate and filled per protocol. 

## 2015-09-02 ENCOUNTER — Ambulatory Visit (INDEPENDENT_AMBULATORY_CARE_PROVIDER_SITE_OTHER): Payer: Medicare Other | Admitting: Physician Assistant

## 2015-09-02 ENCOUNTER — Encounter: Payer: Self-pay | Admitting: Physician Assistant

## 2015-09-02 VITALS — BP 130/84 | HR 72 | Temp 98.4°F | Resp 18 | Wt 181.0 lb

## 2015-09-02 DIAGNOSIS — E119 Type 2 diabetes mellitus without complications: Secondary | ICD-10-CM

## 2015-09-02 DIAGNOSIS — Z1211 Encounter for screening for malignant neoplasm of colon: Secondary | ICD-10-CM

## 2015-09-02 DIAGNOSIS — J988 Other specified respiratory disorders: Secondary | ICD-10-CM

## 2015-09-02 DIAGNOSIS — E785 Hyperlipidemia, unspecified: Secondary | ICD-10-CM

## 2015-09-02 DIAGNOSIS — K219 Gastro-esophageal reflux disease without esophagitis: Secondary | ICD-10-CM | POA: Diagnosis not present

## 2015-09-02 DIAGNOSIS — B9689 Other specified bacterial agents as the cause of diseases classified elsewhere: Principal | ICD-10-CM

## 2015-09-02 DIAGNOSIS — I10 Essential (primary) hypertension: Secondary | ICD-10-CM

## 2015-09-02 DIAGNOSIS — Z1212 Encounter for screening for malignant neoplasm of rectum: Secondary | ICD-10-CM

## 2015-09-02 LAB — HEMOGLOBIN A1C, FINGERSTICK: Hgb A1C (fingerstick): 6.2 % — ABNORMAL HIGH (ref <5.7)

## 2015-09-02 MED ORDER — AZITHROMYCIN 250 MG PO TABS: ORAL_TABLET | ORAL | Status: DC

## 2015-09-02 NOTE — Progress Notes (Signed)
Patient ID: Aaron Mcfarland MRN: [id number], DOB: Sep 16, 1941, 74 y.o. Date of Encounter: _0 @  Chief Complaint:  Chief Complaint  Patient presents with  . 3 mth check up    HPI: 74 y.o. year old AA male  presents for above.  I saw him as a new patient to our office on 04/24/13. At that time he reported that his prior PCP, who was in [place], had retired 3 months prior. He reported that his last visit there was April 2014. He reported that his wife, [name], recently became a patient at this office and had been pleased. She recommended that he come to our office also since his PCP retired. He worked at [employer]. Played softball until age 45!!! 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  Depending on the weather--he either --- walks 40 minutes twice a day. He does this walking with his with his wife----OR------he and his wife walk once a day around 2 PM. Then he also does elliptical and bicycle for 15 minutes per day and uses a rowing machine.  Does exercise 5 days per week.  He checks his blood pressure every day and gets good readings. He was checking his blood sugar every morning and was getting between 90-120. I told him he did not have to cont to check every day--he has decreased to checking twice a week--gets around 110-130.   At prior lab we have told him to cut his Lipitor dose in half ---and only take Mon/Wed/Fri--- he says that he is taking this with these directions.    As well at last lab we have told him to cut his metformin down in half from 1000 twice a day to 500 twice a day. He also reports that he did make this change.  Also at last office visit I added aspirin 81 mg daily. He states that he did add this medication and is taking it as directed.  Taking  all medications as directed with no adverse effects.  At [place] 11/2014-- he reported that at night he felt acid come up in his throat.  Never happened during  the day. Only at night.  Was eaating "dinner "  At 6 pm.  Ate a sandwich at 10:00. Goes to bed at midnight.  When in bed, was feeling this acid. Often he was woken several hours after going to bed with acid coming up in his throat.  At [place] 02/2015 he reports he stopped eating the sandwich at 10pm. "I just eat enough at dinner that I'm not hungry later, now"---reflux has stopped.      At [place] 09/02/15 he reports that he has been having the following symptoms for 2 or 3 weeks now. His had some sore throat and also thick dark mucus. Some thick dark mucus from his nose but says that it seems to be more in his chest than his nose. No known fevers or chills.   Past Medical History  Diagnosis Date  . Diabetes mellitus without complication   . Hyperlipidemia   . Hypertension      Home Meds:  Outpatient Prescriptions Prior to Visit  Medication Sig Dispense Refill  . aspirin 81 MG tablet Take 1 tablet (81 mg total) by mouth daily. 30 tablet 11  . atorvastatin (LIPITOR) 10 MG tablet TAKE ONE TABLET BY MOUTH ONCE DAILY AT BEDTIME 30 tablet 0  . benazepril (LOTENSIN) 10 MG tablet TAKE  ONE TABLET BY MOUTH ONCE DAILY 30 tablet 3  . Blood Glucose Monitoring Suppl (ONE TOUCH ULTRA SYSTEM KIT) W/DEVICE KIT 1 kit by Does not apply route once. 1 each 0  . cetirizine (ZYRTEC) 10 MG tablet Take 10 mg by mouth daily.    Marland Kitchen glucose blood test strip Check blood sugar each day fasting. 50 each 11  . Lancets (ONETOUCH ULTRASOFT) lancets Check blood sugar once daily fasting 100 each 6  . latanoprost (XALATAN) 0.005 % ophthalmic solution Place 1 drop into the left eye at bedtime.    . metFORMIN (GLUCOPHAGE) 500 MG tablet Take 1 tablet (500 mg total) by mouth 2 (two) times daily with a meal. 180 tablet 3  . Multiple Vitamins-Minerals (MULTIVITAMIN PO) Take by mouth.    . triamterene-hydrochlorothiazide (MAXZIDE-25) 37.5-25 MG tablet TAKE ONE TABLET BY MOUTH ONCE DAILY 90 tablet 0   No facility-administered medications  prior to visit.     Allergies: No Known Allergies  Social History   Social History  . Marital Status: Married    Spouse Name: N/A  . Number of Children: N/A  . Years of Education: N/A   Occupational History  . Not on file.   Social History Main Topics  . Smoking status: Former Smoker    Quit date: 04/24/1966  . Smokeless tobacco: Never Used  . Alcohol Use: Yes  . Drug Use: No  . Sexual Activity: Yes    Birth Control/ Protection: None   Other Topics Concern  . Not on file   Social History Narrative    Family History  Problem Relation Age of Onset  . Cancer Mother     ovarian  . Hypertension Father   . Stroke Father   . Diabetes Brother   . Heart disease Brother   . Stroke Brother      Review of Systems:  See HPI for pertinent ROS. All other ROS negative.    Physical Exam: Blood pressure 130/84, pulse 72, temperature 98.4 F (36.9 C), temperature source Oral, resp. rate 18, weight 181 lb (82.101 kg)., There is no weight on file to calculate BMI. General: WNWD AAM. Appears in no acute distress. HEENT: Ears normal. Posterior pharynx/tonsils normal. Minimal erythema. No exudate. No peritonsillar abscess. No tenderness with percussion of frontal or maxillary sinuses bilaterally. Neck: Supple. No thyromegaly. No lymphadenopathy. No carotid bruits. Lungs: Clear bilaterally to auscultation without wheezes, rales, or rhonchi. Breathing is unlabored. Heart: RRR with S1 S2. No murmurs, rubs, or gallops. Abdomen: Soft, non-tender, non-distended with normoactive bowel sounds. No hepatomegaly. No rebound/guarding. No obvious abdominal masses. No tenderness with palpation, even in epigastric area.  Musculoskeletal:  Strength and tone normal for age. Extremities/Skin: Warm and dry. Neuro: Alert and oriented X 3. Moves all extremities spontaneously. Gait is normal. CNII-XII grossly in tact. Psych:  Responds to questions appropriately with a normal affect. Diabetic foot exam:  Inspection is normal. No other wounds and no calluses or concerning areas. Sensation intact throughout. He has 2+ dorsalis pedis and posterior tibial pulses bilaterally.      ASSESSMENT AND PLAN:  74 y.o. year old male with    1. Bacterial respiratory infection He is to take antibiotic as directed. If he needs over-the-counter decongestants or cough medicines for symptom relief in the interim he continues these. F/U if symptoms do not resolve within 1 week after completion of antibiotic. - azithromycin (ZITHROMAX) 250 MG tablet; Day 1: Take 2 daily.  Days 2-5: Take 1 daily.  Dispense: 6  tablet; Refill: 0   Diabetes mellitus without complication  - Hemoglobin A1c  MicroAlbumin----Done 06/01/2015  At prior OV he reported he had not had an eye exam in almost 15 years. We had discussed need for diabetic eye exam.   at prior OV.  At [place] --02/10/14--He reported that he did see Eye Doctor.  Has Glaucoma Left Eye. Has Cataract Right Eye. Also says he is getting glasses. At [place] 2/16 reports had recent cataract surgery. Says last Eye Exam was 10/2014--Pt reports there was no diabetic retinopathy/ diabetic eye problems.    Diabetic Foot Exam: Documented in Quality Metrics 06/01/2015  He is on statin. He is now on ACE Inhibitor-- benazepril 34m QD.  He is now on Aspirin 847m     Hyperlipidemia Lipids excellent and we actually had him decrease his Lipitor to half of a pill on Monday Wednesday Friday only and he is taking this dose. Had FLP/LFT 11/2014--LDL 36. HDL 56----Excellent Had FLP/LFT 06/01/15--LDL 60. --Excellent  Hypertension He is now on ACE inhibitor. Blood Pressure at goal. CMET--nml 11/2014, 06/01/15  GERD At [facility] 11/2014--He was to stop eating and drinking several hours prior to going to bed.  [place] he reports he did make this change and symptoms have resolved.   06/01/2015---He had CPE/ Visit for preventive health examination --Had Screening Labs--All normal. - CBC with  Differential/Platelet----------------------------Normal - COMPLETE METABOLIC PANEL WITH GFR------Normal - Lipid panel-----------------------------------------------------Excellent - TSH-------------------------------------------------------------Normal - Vit D  25 hydroxy (rtn osteoporosis monitoring)-------Normal @ 48 . Screening for colorectal cancer - Fecal occult blood, imunochemical; Future------- he did these and these were negative 3 - Fecal occult blood, imunochemical; Future - Fecal occult blood, imunochemical; Future    Screening for colorectal cancer At prior office visit we discussed the fact that his last colonoscopy was 10 years ago.  [place] I  asked him about this and he said he called his insurance and they were going to cover 80% but not the full cost. He stated that he cannot afford this. He was agreeable to do Hemoccults ---He did turn in Hemoccult x3 ---- they were negative x3.---10/31/2013 06/01/2015--He is agreeable to do another set of HemeOccult Cards----------- he did these and these were negative 3-------06/2015  Prostate Cancer Screening: No further screening indicated as he is now age 89 Immunizations:  Influenza vaccine: Tetanus: This was updated at our visit 07/25/13 Pneumonia vaccine: He was given Pneumovax 23 here-- 07/25/13.     PREVNAR 13---given here 08/14/2014 Zostavax: Discussed this at the last visit. He says that he checked and his cost is going to be $95 and he defers.  Routine Office visit 3 months or sooner if needed.    Signed, [address]i[place]ied[place]PAU[place]BSNovamed [facility] LLC1/30/2016 8:09 AM

## 2015-09-14 ENCOUNTER — Encounter: Payer: Self-pay | Admitting: Family Medicine

## 2015-09-21 ENCOUNTER — Telehealth: Payer: Self-pay | Admitting: Physician Assistant

## 2015-09-21 MED ORDER — LEVOFLOXACIN 750 MG PO TABS: 750.0000 mg | ORAL_TABLET | Freq: Every day | ORAL | Status: DC

## 2015-09-21 NOTE — Telephone Encounter (Signed)
At [place], Rxed [place].  NOW--Send in Rx for Levaquin 750mg  1 po QD x 7 days. # 7 +0  Tell him to f/u with [place] if does not resolve after this.

## 2015-09-21 NOTE — Telephone Encounter (Signed)
Pt is calling to let [facility] Ree know how he is feeling. [phone]

## 2015-09-21 NOTE — Telephone Encounter (Signed)
rx to pharmacy and pt aware.  Will need to be seen if not better after this.

## 2015-09-21 NOTE — Telephone Encounter (Signed)
Still with URI from end of November.  Told to call if not better.  Still with productive cough, worse at night.

## 2015-10-13 DIAGNOSIS — E119 Type 2 diabetes mellitus without complications: Secondary | ICD-10-CM | POA: Diagnosis not present

## 2015-10-13 DIAGNOSIS — H47233 Glaucomatous optic atrophy, bilateral: Secondary | ICD-10-CM | POA: Diagnosis not present

## 2015-11-09 ENCOUNTER — Other Ambulatory Visit: Payer: Self-pay | Admitting: Physician Assistant

## 2015-11-09 NOTE — Telephone Encounter (Signed)
Medication refilled per protocol. 

## 2015-12-02 ENCOUNTER — Ambulatory Visit (INDEPENDENT_AMBULATORY_CARE_PROVIDER_SITE_OTHER): Payer: Medicare Other | Admitting: Physician Assistant

## 2015-12-02 ENCOUNTER — Encounter: Payer: Self-pay | Admitting: Physician Assistant

## 2015-12-02 VITALS — BP 110/60 | HR 78 | Temp 97.9°F | Resp 18 | Wt 183.5 lb

## 2015-12-02 DIAGNOSIS — E119 Type 2 diabetes mellitus without complications: Secondary | ICD-10-CM | POA: Diagnosis not present

## 2015-12-02 DIAGNOSIS — I10 Essential (primary) hypertension: Secondary | ICD-10-CM

## 2015-12-02 DIAGNOSIS — K219 Gastro-esophageal reflux disease without esophagitis: Secondary | ICD-10-CM | POA: Diagnosis not present

## 2015-12-02 DIAGNOSIS — E785 Hyperlipidemia, unspecified: Secondary | ICD-10-CM

## 2015-12-02 LAB — COMPLETE METABOLIC PANEL WITH GFR
ALBUMIN: 4.6 g/dL (ref 3.6–5.1)
ALK PHOS: 76 U/L (ref 40–115)
ALT: 24 U/L (ref 9–46)
AST: 18 U/L (ref 10–35)
BILIRUBIN TOTAL: 0.9 mg/dL (ref 0.2–1.2)
BUN: 15 mg/dL (ref 7–25)
CALCIUM: 9.7 mg/dL (ref 8.6–10.3)
CO2: 26 mmol/L (ref 20–31)
CREATININE: 1.12 mg/dL (ref 0.70–1.18)
Chloride: 103 mmol/L (ref 98–110)
GFR, EST AFRICAN AMERICAN: 74 mL/min (ref >=60)
GFR, EST NON AFRICAN AMERICAN: 64 mL/min (ref >=60)
Glucose, Bld: 213 mg/dL — ABNORMAL HIGH (ref 70–99)
Potassium: 4.4 mmol/L (ref 3.5–5.3)
Sodium: 138 mmol/L (ref 135–146)
TOTAL PROTEIN: 7.8 g/dL (ref 6.1–8.1)

## 2015-12-02 LAB — LIPID PANEL
Cholesterol: 129 mg/dL (ref 125–200)
HDL: 63 mg/dL (ref >=40)
LDL Cholesterol: 50 mg/dL (ref <130)
TRIGLYCERIDES: 82 mg/dL (ref <150)
Total CHOL/HDL Ratio: 2 Ratio (ref <=5.0)
VLDL: 16 mg/dL (ref <30)

## 2015-12-02 LAB — HEMOGLOBIN A1C
Hgb A1c MFr Bld: 7 % — ABNORMAL HIGH (ref <5.7)
Mean Plasma Glucose: 154 mg/dL — ABNORMAL HIGH (ref <117)

## 2015-12-02 NOTE — Progress Notes (Signed)
Patient ID: Kalven Ganim MRN: [id number], DOB: [birth year], 75 y.o. Date of Encounter: _0 @  Chief Complaint:  Chief Complaint  Patient presents with  . Follow-up    3 mos, still having problems with sinus, having hard time swallowing x 2 weeks    HPI: 75 y.o. year old AA male  presents for above.  I saw him as a new patient to our office on 04/24/13. At that time he reported that his prior PCP, who was in [place], had retired 3 months prior. He reported that his last visit there was April 2014. He reported that his wife, [name], recently became a patient at this office and had been pleased. She recommended that he come to our office also since his PCP retired. He worked at [employer]. Played softball until age 67!!! 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  Depending on the weather--he either --- walks 40 minutes twice a day. He does this walking with his with his wife----OR------he and his wife walk once a day around 2 PM. Then he also does elliptical and bicycle for 15 minutes per day and uses a rowing machine.  Does exercise 5 days per week.  He checks his blood pressure every day and gets good readings. He was checking his blood sugar every morning and was getting between 90-120. I told him he did not have to cont to check every day--he has decreased to checking twice a week--gets around 110-130.   At prior lab we have told him to cut his Lipitor dose in half ---and only take Mon/Wed/Fri--- he says that he is taking this with these directions.    As well at last lab we have told him to cut his metformin down in half from 1000 twice a day to 500 twice a day. He also reports that he did make this change.  Also at last office visit I added aspirin 81 mg daily. He states that he did add this medication and is taking it as directed.  Taking  all medications as directed with no adverse effects.  At [place] 11/2014-- he  reported that at night he felt acid come up in his throat.  Never happened during the day. Only at night.  Was eaating "dinner "  At 6 pm.  Ate a sandwich at 10:00. Goes to bed at midnight.  When in bed, was feeling this acid. Often he was woken several hours after going to bed with acid coming up in his throat.  At [place] 02/2015 he reports he stopped eating the sandwich at 10pm. "I just eat enough at dinner that I'm not hungry later, now"---reflux has stopped.      At [place] 12/02/2015:  He reports the following: Says that after the antibiotics I prescribed last time, his symptoms cleared and stayed resolved for 2 weeks. However, after that, started developing symptoms again. Says that he feels drainage in his throat. It feels like it is coming down from his nose. Says that when he blows his nose it is watery clear. Says that there is no thick dark mucus. He is not feeling any congestion in his chest. Has had no fevers or chills. Says he has been using Equate brand medicine similar to mucinex-DM--says has guafennisin.  Discussed with him that I see Zyrtec on his medication list and asked if he is currently taking this. Replies that he only takes this as needed  for seasonal allergies --that he has allergy to [facility] and starts naming other specific allergies. No other complaints or concerns at this visit today. Diabetic medications as directed with no adverse effects. In his Lipitor as directed above with no adverse effects.   Past Medical History  Diagnosis Date  . Diabetes mellitus without complication ([place])   . Hyperlipidemia   . Hypertension      Home Meds:  Outpatient Prescriptions Prior to Visit  Medication Sig Dispense Refill  . aspirin 81 MG tablet Take 1 tablet (81 mg total) by mouth daily. 30 tablet 11  . atorvastatin (LIPITOR) 10 MG tablet TAKE ONE TABLET BY MOUTH ONCE DAILY AT BEDTIME 30 tablet 0  . benazepril (LOTENSIN) 10 MG tablet TAKE ONE TABLET BY MOUTH ONCE DAILY 30 tablet 3  .  Blood Glucose Monitoring Suppl (ONE TOUCH ULTRA SYSTEM KIT) W/DEVICE KIT 1 kit by Does not apply route once. 1 each 0  . cetirizine (ZYRTEC) 10 MG tablet Take 10 mg by mouth daily.    Marland Kitchen glucose blood test strip Check blood sugar each day fasting. 50 each 11  . Lancets (ONETOUCH ULTRASOFT) lancets Check blood sugar once daily fasting 100 each 6  . latanoprost (XALATAN) 0.005 % ophthalmic solution Place 1 drop into the left eye at bedtime.    . metFORMIN (GLUCOPHAGE) 500 MG tablet Take 1 tablet (500 mg total) by mouth 2 (two) times daily with a meal. 180 tablet 3  . Multiple Vitamins-Minerals (MULTIVITAMIN PO) Take by mouth.    . triamterene-hydrochlorothiazide (MAXZIDE-25) 37.5-25 MG tablet TAKE ONE TABLET BY MOUTH ONCE DAILY 90 tablet 0  . levofloxacin (LEVAQUIN) 750 MG tablet Take 1 tablet (750 mg total) by mouth daily. 7 tablet 0   No facility-administered medications prior to visit.     Allergies: No Known Allergies  Social History   Social History  . Marital Status: Married    Spouse Name: N/A  . Number of Children: N/A  . Years of Education: N/A   Occupational History  . Not on file.   Social History Main Topics  . Smoking status: Former Smoker    Quit date: 04/24/1966  . Smokeless tobacco: Never Used  . Alcohol Use: Yes  . Drug Use: No  . Sexual Activity: Yes    Birth Control/ Protection: None   Other Topics Concern  . Not on file   Social History Narrative    Family History  Problem Relation Age of Onset  . Cancer Mother     ovarian  . Hypertension Father   . Stroke Father   . Diabetes Brother   . Heart disease Brother   . Stroke Brother      Review of Systems:  See HPI for pertinent ROS. All other ROS negative.    Physical Exam: Blood pressure 110/60, pulse 78, temperature 97.9 F (36.6 C), temperature source Oral, resp. rate 18, weight 183 lb 8 oz (83.235 kg)., Body mass index is 25.6 kg/(m^2). General: WNWD AAM. Appears in no acute  distress. HEENT: Ears normal. Posterior pharynx/tonsils normal. Minimal erythema. No exudate. No peritonsillar abscess. No tenderness with percussion of frontal or maxillary sinuses bilaterally. Neck: Supple. No thyromegaly. No lymphadenopathy. No carotid bruits. Lungs: Clear bilaterally to auscultation without wheezes, rales, or rhonchi. Breathing is unlabored. Heart: RRR with S1 S2. No murmurs, rubs, or gallops. Abdomen: Soft, non-tender, non-distended with normoactive bowel sounds. No hepatomegaly. No rebound/guarding. No obvious abdominal masses. No tenderness with palpation, even in epigastric area.  Musculoskeletal:  Strength and tone normal for age. Extremities/Skin: Warm and dry. Neuro: Alert and oriented X 3. Moves all extremities spontaneously. Gait is normal. CNII-XII grossly in tact. Psych:  Responds to questions appropriately with a normal affect. Diabetic foot exam: Inspection is normal. No other wounds and no calluses or concerning areas. Sensation intact throughout. He has 2+ dorsalis pedis and posterior tibial pulses bilaterally.      ASSESSMENT AND PLAN:  75 y.o. year old male with   Allergic Rhinitis:  Discussed that I feel symptoms are consistent with allergic rhinitis and recommend he start taking Zyrtec daily. Probably having symptoms sooner this year than usual because of the unusually warm weather and that things are already blooming. Told to follow-up if symptoms not controlled with this.   Diabetes mellitus without complication  - Hemoglobin A1c  MicroAlbumin----Done 06/01/2015  At prior OV he reported he had not had an eye exam in almost 15 years. We had discussed need for diabetic eye exam.   at prior OV.  At [place] --02/10/14--He reported that he did see Eye Doctor.  Has Glaucoma Left Eye. Has Cataract Right Eye. Also says he is getting glasses. At [place] 2/16 reports had recent cataract surgery. Says last Eye Exam was 10/2014--Pt reports there was no diabetic  retinopathy/ diabetic eye problems.   At [place] 12/02/15 he states that he did have another diabetic eye exam this year--January 2017  Diabetic Foot Exam: Documented in Quality Metrics 06/01/2015  He is on statin. He is now on ACE Inhibitor-- benazepril 89m QD.  He is now on Aspirin 82m     Hyperlipidemia Lipids excellent and we actually had him decrease his Lipitor to half of a pill on Monday Wednesday Friday only and he is taking this dose. Had FLP/LFT 11/2014--LDL 36. HDL 56----Excellent Had FLP/LFT 06/01/15--LDL 60. --Excellent  Hypertension He is now on ACE inhibitor. Blood Pressure at goal. CMET--nml 11/2014, 06/01/15  GERD At [place] 11/2014--He was to stop eating and drinking several hours prior to going to bed.  [place] he reports he did make this change and symptoms have resolved.   06/01/2015---He had CPE/ Visit for preventive health examination --Had Screening Labs--All normal. - CBC with Differential/Platelet----------------------------Normal - COMPLETE METABOLIC PANEL WITH GFR------Normal - Lipid panel-----------------------------------------------------Excellent - TSH-------------------------------------------------------------Normal - Vit D  25 hydroxy (rtn osteoporosis monitoring)-------Normal @ 48 . Screening for colorectal cancer - Fecal occult blood, imunochemical; Future------- he did these and these were negative 3 - Fecal occult blood, imunochemical; Future - Fecal occult blood, imunochemical; Future    Screening for colorectal cancer At prior office visit we discussed the fact that his last colonoscopy was 10 years ago.  [place] I  asked him about this and he said he called his insurance and they were going to cover 80% but not the full cost. He stated that he cannot afford this. He was agreeable to do Hemoccults ---He did turn in Hemoccult x3 ---- they were negative x3.---10/31/2013 06/01/2015--He is agreeable to do another set of HemeOccult  Cards----------- he did these and these were negative 3-------06/2015  Prostate Cancer Screening: No further screening indicated as he is now age 75 Immunizations:  Influenza vaccine: Tetanus: This was updated at our visit 07/25/13 Pneumonia vaccine: He was given Pneumovax 23 here-- 07/25/13.     PREVNAR 13---given here 08/14/2014 Zostavax: Discussed this at the last visit. He says that he checked and his cost is going to be $95 and he defers.  Routine Office  visit 3 months or sooner if needed.    Signed, [address] [place], [place], [facility] 12/02/2015 8:56 AM

## 2015-12-03 ENCOUNTER — Encounter: Payer: Self-pay | Admitting: Family Medicine

## 2015-12-03 ENCOUNTER — Other Ambulatory Visit: Payer: Self-pay | Admitting: Family Medicine

## 2015-12-03 MED ORDER — METFORMIN HCL 500 MG PO TABS: ORAL_TABLET | ORAL | Status: DC

## 2016-02-15 ENCOUNTER — Other Ambulatory Visit: Payer: Self-pay | Admitting: Physician Assistant

## 2016-02-15 NOTE — Telephone Encounter (Signed)
Medication refilled per protocol. 

## 2016-03-14 ENCOUNTER — Encounter: Payer: Self-pay | Admitting: Physician Assistant

## 2016-03-14 ENCOUNTER — Ambulatory Visit (INDEPENDENT_AMBULATORY_CARE_PROVIDER_SITE_OTHER): Payer: Medicare Other | Admitting: Physician Assistant

## 2016-03-14 VITALS — BP 136/70 | HR 68 | Temp 97.9°F | Resp 18 | Wt 172.0 lb

## 2016-03-14 DIAGNOSIS — I10 Essential (primary) hypertension: Secondary | ICD-10-CM

## 2016-03-14 DIAGNOSIS — E785 Hyperlipidemia, unspecified: Secondary | ICD-10-CM | POA: Diagnosis not present

## 2016-03-14 DIAGNOSIS — E119 Type 2 diabetes mellitus without complications: Secondary | ICD-10-CM | POA: Diagnosis not present

## 2016-03-14 LAB — HEMOGLOBIN A1C, FINGERSTICK: HEMOGLOBIN A1C, FINGERSTICK: 6.8 % — AB (ref <5.7)

## 2016-03-14 MED ORDER — METFORMIN HCL 500 MG PO TABS: ORAL_TABLET | ORAL | Status: DC

## 2016-03-14 NOTE — Progress Notes (Signed)
Patient ID: Aaron Mcfarland MRN: [id number], DOB: Oct 11, 1940, 75 y.o. Date of Encounter: _0 @  Chief Complaint:  Chief Complaint  Patient presents with  . 3 mth check up    is fasting    HPI: 75 y.o. year old AA male  presents for above.  I saw him as a new patient to our office on 04/24/13. At that time he reported that his prior PCP, who was in [place], had retired 3 months prior. He reported that his last visit there was April 2014. He reported that his wife, [name], recently became a patient at this office and had been pleased. She recommended that he come to our office also since his PCP retired. He worked at [employer]. Played softball until age 46!!! 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  Depending on the weather--he either --- walks 40 minutes twice a day. He does this walking with his with his wife----OR------he and his wife walk once a day around 2 PM. Then he also does elliptical and bicycle for 15 minutes per day and uses a rowing machine.  Does exercise 5 days per week.  He checks his blood pressure every day and gets good readings. He was checking his blood sugar every morning and was getting between 90-120. I told him he did not have to cont to check every day--he has decreased to checking twice a week--gets around 110-130.   At prior lab we have told him to cut his Lipitor dose in half ---and only take Mon/Wed/Fri--- he says that he is taking this with these directions.    As well at last lab we have told him to cut his metformin down in half from 1000 twice a day to 500 twice a day. He also reports that he did make this change.  Also at last office visit I added aspirin 81 mg daily. He states that he did add this medication and is taking it as directed.  Taking  all medications as directed with no adverse effects.  At [place] 11/2014-- he reported that at night he felt acid come up in his throat.  Never  happened during the day. Only at night.  Was eaating "dinner "  At 6 pm.  Ate a sandwich at 10:00. Goes to bed at midnight.  When in bed, was feeling this acid. Often he was woken several hours after going to bed with acid coming up in his throat.  At [place] 02/2015 he reports he stopped eating the sandwich at 10pm. "I just eat enough at dinner that I'm not hungry later, now"---reflux has stopped.      At [place] 12/02/2015:  He reports the following: Says that after the antibiotics I prescribed last time, his symptoms cleared and stayed resolved for 2 weeks. However, after that, started developing symptoms again. Says that he feels drainage in his throat. It feels like it is coming down from his nose. Says that when he blows his nose it is watery clear. Says that there is no thick dark mucus. He is not feeling any congestion in his chest. Has had no fevers or chills. Says he has been using Equate brand medicine similar to mucinex-DM--says has guafennisin.  Discussed with him that I see Zyrtec on his medication list and asked if he is currently taking this. Replies that he only takes this as needed for seasonal allergies --that he has allergy to [facility]  and starts naming other specific allergies. No other complaints or concerns at this visit today. Diabetic medications as directed with no adverse effects. In his Lipitor as directed above with no adverse effects.  OV 03/14/2016: At [place] 12/2015 A1C was slightly high and had been creeping up. At that time -- increased Metformin from 583m 1 BID to 2 in am and 1 pm.  At OV 03/14/2016--he says 75 he did increase / adjust the dose and is taking it correctly/as directed.  Checks BS ~ twice a week. Getting good readings. 93 this morning.  Taking Lipitor 15m No myalgias or other adverse effects.    Past Medical History  Diagnosis Date  . Diabetes mellitus without complication ([facility]  . Hyperlipidemia   . Hypertension      Home Meds:  Outpatient Prescriptions Prior  to Visit  Medication Sig Dispense Refill  . aspirin 81 MG tablet Take 1 tablet (81 mg total) by mouth daily. 30 tablet 11  . atorvastatin (LIPITOR) 10 MG tablet TAKE ONE TABLET BY MOUTH ONCE DAILY AT BEDTIME 30 tablet 0  . benazepril (LOTENSIN) 10 MG tablet TAKE ONE TABLET BY MOUTH ONCE DAILY 30 tablet 3  . Blood Glucose Monitoring Suppl (ONE TOUCH ULTRA SYSTEM KIT) W/DEVICE KIT 1 kit by Does not apply route once. 1 each 0  . glucose blood test strip Check blood sugar each day fasting. 50 each 11  . Lancets (ONETOUCH ULTRASOFT) lancets Check blood sugar once daily fasting 100 each 6  . latanoprost (XALATAN) 0.005 % ophthalmic solution Place 1 drop into the left eye at bedtime.    . Multiple Vitamins-Minerals (MULTIVITAMIN PO) Take by mouth.    . triamterene-hydrochlorothiazide (MAXZIDE-25) 37.5-25 MG tablet TAKE ONE TABLET BY MOUTH ONCE DAILY 90 tablet 0  . metFORMIN (GLUCOPHAGE) 500 MG tablet Take 2 tabs by mouth in the morning and 1 tab by mouth in the evening. 90 tablet 3  . cetirizine (ZYRTEC) 10 MG tablet Take 10 mg by mouth daily.     No facility-administered medications prior to visit.     Allergies: No Known Allergies  Social History   Social History  . Marital Status: Married    Spouse Name: N/A  . Number of Children: N/A  . Years of Education: N/A   Occupational History  . Not on file.   Social History Main Topics  . Smoking status: Former Smoker    Quit date: 04/24/1966  . Smokeless tobacco: Never Used  . Alcohol Use: Yes  . Drug Use: No  . Sexual Activity: Yes    Birth Control/ Protection: None   Other Topics Concern  . Not on file   Social History Narrative    Family History  Problem Relation Age of Onset  . Cancer Mother     ovarian  . Hypertension Father   . Stroke Father   . Diabetes Brother   . Heart disease Brother   . Stroke Brother      Review of Systems:  See HPI for pertinent ROS. All other ROS negative.    Physical Exam: Blood  pressure 136/70, pulse 68, temperature 97.9 F (36.6 C), temperature source Oral, resp. rate 18, weight 172 lb (78.019 kg)., Body mass index is 24 kg/(m^2). General: WNWD AAM. Appears in no acute distress. Neck: Supple. No thyromegaly. No lymphadenopathy. No carotid bruits. Lungs: Clear bilaterally to auscultation without wheezes, rales, or rhonchi. Breathing is unlabored. Heart: RRR with S1 S2. No murmurs, rubs, or gallops. Abdomen: Soft, non-tender,  non-distended with normoactive bowel sounds. No hepatomegaly. No rebound/guarding. No obvious abdominal masses. Musculoskeletal:  Strength and tone normal for age. Extremities/Skin: Warm and dry. Neuro: Alert and oriented X 3. Moves all extremities spontaneously. Gait is normal. CNII-XII grossly in tact. Psych:  Responds to questions appropriately with a normal affect. Diabetic foot exam: Inspection is normal. No other wounds and no calluses or concerning areas. Sensation intact throughout. He has 2+ dorsalis pedis and posterior tibial pulses bilaterally.      ASSESSMENT AND PLAN:  75 y.o. year old male with    Diabetes mellitus without complication  - Hemoglobin A1c  MicroAlbumin----Done 06/01/2015  At prior OV he reported he had not had an eye exam in almost 15 years. We had discussed need for diabetic eye exam.   at prior OV.  At [place] --02/10/14--He reported that he did see Eye Doctor.  Has Glaucoma Left Eye. Has Cataract Right Eye. Also says he is getting glasses. At [place] 2/16 reports had recent cataract surgery. Says last Eye Exam was 10/2014--Pt reports there was no diabetic retinopathy/ diabetic eye problems.   At [place] 12/02/15 he states that he did have another diabetic eye exam this year--January 2017  Diabetic Foot Exam: Documented in Quality Metrics 06/01/2015  He is on statin. He is now on ACE Inhibitor-- benazepril 41m QD.  He is now on Aspirin 863m     Hyperlipidemia Lipids excellent and we actually had him decrease his  Lipitor to half of a pill on Monday Wednesday Friday only and he is taking this dose. Had FLP/LFT 11/2014--LDL 36. HDL 56----Excellent Had FLP/LFT 06/01/15--LDL 60. --Excellent Had FLP/LFT 12/2015---LDL 50--excellent  Hypertension He is now on ACE inhibitor. Blood Pressure at goal. CMET--nml 11/2014, 06/01/15, 12/2015  GERD At [place] 11/2014--He was to stop eating and drinking several hours prior to going to bed.  At [place] he reports he did make this change and symptoms have resolved.   06/01/2015---He had CPE/ Visit for preventive health examination --Had Screening Labs--All normal. - CBC with Differential/Platelet----------------------------Normal - COMPLETE METABOLIC PANEL WITH GFR------Normal - Lipid panel-----------------------------------------------------Excellent - TSH-------------------------------------------------------------Normal - Vit D  25 hydroxy (rtn osteoporosis monitoring)-------Normal @ 48 . Screening for colorectal cancer - Fecal occult blood, imunochemical; Future------- he did these and these were negative 3 - Fecal occult blood, imunochemical; Future - Fecal occult blood, imunochemical; Future    Screening for colorectal cancer At prior office visit we discussed the fact that his last colonoscopy was 10 years ago.  At [place] I  asked him about this and he said he called his insurance and they were going to cover 80% but not the full cost. He stated that he cannot afford this. He was agreeable to do Hemoccults ---He did turn in Hemoccult x3 ---- they were negative x3.---10/31/2013 06/01/2015--He is agreeable to do another set of HemeOccult Cards----------- he did these and these were negative 3-------06/2015  Prostate Cancer Screening: No further screening indicated as he is now age 75 Immunizations:  Influenza vaccine: Tetanus: This was updated at our visit 07/25/13 Pneumonia vaccine: He was given Pneumovax 23 here-- 07/25/13.     PREVNAR 13---given here  08/14/2014 Zostavax: Discussed this at the last visit. He says that he checked and his cost is going to be $95 and he defers.  Routine Office visit 3 months or sooner if needed.    Signed, [address]/09/2016 8:25 AM

## 2016-03-15 ENCOUNTER — Other Ambulatory Visit: Payer: Self-pay | Admitting: Family Medicine

## 2016-04-21 ENCOUNTER — Other Ambulatory Visit: Payer: Self-pay | Admitting: Physician Assistant

## 2016-06-16 ENCOUNTER — Ambulatory Visit: Payer: Self-pay | Admitting: Physician Assistant

## 2016-06-16 ENCOUNTER — Encounter: Payer: Self-pay | Admitting: Physician Assistant

## 2016-06-16 ENCOUNTER — Ambulatory Visit (INDEPENDENT_AMBULATORY_CARE_PROVIDER_SITE_OTHER): Payer: Medicare Other | Admitting: Physician Assistant

## 2016-06-16 VITALS — BP 128/74 | HR 74 | Temp 98.0°F | Resp 18 | Wt 172.0 lb

## 2016-06-16 DIAGNOSIS — Z1212 Encounter for screening for malignant neoplasm of rectum: Secondary | ICD-10-CM | POA: Diagnosis not present

## 2016-06-16 DIAGNOSIS — I10 Essential (primary) hypertension: Secondary | ICD-10-CM | POA: Diagnosis not present

## 2016-06-16 DIAGNOSIS — K219 Gastro-esophageal reflux disease without esophagitis: Secondary | ICD-10-CM

## 2016-06-16 DIAGNOSIS — E119 Type 2 diabetes mellitus without complications: Secondary | ICD-10-CM | POA: Diagnosis not present

## 2016-06-16 DIAGNOSIS — E785 Hyperlipidemia, unspecified: Secondary | ICD-10-CM | POA: Diagnosis not present

## 2016-06-16 DIAGNOSIS — Z1211 Encounter for screening for malignant neoplasm of colon: Secondary | ICD-10-CM

## 2016-06-16 LAB — LIPID PANEL
CHOL/HDL RATIO: 2 ratio (ref <=5.0)
Cholesterol: 148 mg/dL (ref 125–200)
HDL: 75 mg/dL (ref >=40)
LDL CALC: 56 mg/dL (ref <130)
Triglycerides: 85 mg/dL (ref <150)
VLDL: 17 mg/dL (ref <30)

## 2016-06-16 LAB — COMPLETE METABOLIC PANEL WITH GFR
ALBUMIN: 4.4 g/dL (ref 3.6–5.1)
ALK PHOS: 80 U/L (ref 40–115)
ALT: 21 U/L (ref 9–46)
AST: 15 U/L (ref 10–35)
BUN: 11 mg/dL (ref 7–25)
CO2: 29 mmol/L (ref 20–31)
Calcium: 10.1 mg/dL (ref 8.6–10.3)
Chloride: 103 mmol/L (ref 98–110)
Creat: 0.94 mg/dL (ref 0.70–1.18)
GFR, Est African American: >89 mL/min (ref >=60)
GFR, Est Non African American: 78 mL/min (ref >=60)
GLUCOSE: 114 mg/dL — AB (ref 70–99)
POTASSIUM: 4.2 mmol/L (ref 3.5–5.3)
SODIUM: 141 mmol/L (ref 135–146)
TOTAL PROTEIN: 7.4 g/dL (ref 6.1–8.1)
Total Bilirubin: 0.7 mg/dL (ref 0.2–1.2)

## 2016-06-16 LAB — HEMOGLOBIN A1C
HEMOGLOBIN A1C: 5.7 % — AB (ref <5.7)
MEAN PLASMA GLUCOSE: 117 mg/dL

## 2016-06-16 NOTE — Progress Notes (Signed)
Patient ID: Aaron Mcfarland MRN: [id number], DOB: 1940-10-11, 75 y.o. Date of Encounter: @DATE @  Chief Complaint:  Chief Complaint  Patient presents with  . Follow-up    HPI: 75 y.o. year old AA male  presents for above.  I saw him as a new patient to our office on 04/24/13. At that time he reported that his prior PCP, who was in [employer], had retired 3 months prior. He reported that his last visit there was April 2014. He reported that his wife, Aaron Mcfarland, recently became a patient at this office and had been pleased. She recommended that he come to our office also since his PCP retired. He worked at [employer]. Played softball until age 51!!! 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  Depending on the weather--he either --- walks 40 minutes twice a day. He does this walking with his with his wife----OR------he and his wife walk once a day around 2 PM. Then he also does elliptical and bicycle for 15 minutes per day and uses a rowing machine.  Does exercise 5 days per week.  He checks his blood pressure every day and gets good readings. He was checking his blood sugar every morning and was getting between 90-120. I told him he did not have to cont to check every day--he has decreased to checking twice a week--gets around 110-130.   At prior lab we have told him to cut his Lipitor dose in half ---and only take Mon/Wed/Fri--- he says that he is taking this with these directions.    As well at last lab we have told him to cut his metformin down in half from 1000 twice a day to 500 twice a day. He also reports that he did make this change.  Also at last office visit I added aspirin 81 mg daily. He states that he did add this medication and is taking it as directed.  Taking  all medications as directed with no adverse effects.  At [place] 11/2014-- he reported that at night he felt acid come up in his throat.  Never happened during the  day. Only at night.  Was eaating "dinner "  At 6 pm.  Ate a sandwich at 10:00. Goes to bed at midnight.  When in bed, was feeling this acid. Often he was woken several hours after going to bed with acid coming up in his throat.  At [place] 02/2015 he reports he stopped eating the sandwich at 10pm. "I just eat enough at dinner that I'm not hungry later, now"---reflux has stopped.      At [place] 12/02/2015:  He reports the following: Says that after the antibiotics I prescribed last time, his symptoms cleared and stayed resolved for 2 weeks. However, after that, started developing symptoms again. Says that he feels drainage in his throat. It feels like it is coming down from his nose. Says that when he blows his nose it is watery clear. Says that there is no thick dark mucus. He is not feeling any congestion in his chest. Has had no fevers or chills. Says he has been using Equate brand medicine similar to mucinex-DM--says has guafennisin.  Discussed with him that I see Zyrtec on his medication list and asked if he is currently taking this. Replies that he only takes this as needed for seasonal allergies --that he has allergy to [facility] and starts naming other specific allergies. No other  complaints or concerns at this visit today. Diabetic medications as directed with no adverse effects. In his Lipitor as directed above with no adverse effects.  OV 03/14/2016: At [place] 12/2015 A1C was slightly high and had been creeping up. At that time -- increased Metformin from 548m 1 BID to 2 in am and 1 pm.  At OV 03/14/2016--he says he did increase / adjust the dose and is taking it correctly/as directed.  Checks BS ~ twice a week. Getting good readings. 93 this morning.  Taking Lipitor 178m No myalgias or other adverse effects.   OV 06/16/2016: He is taking diabetic medications as directed. He still checks blood sugar about twice a week. Continuing to get good readings. He continues to stay very physically active and is very  compliant with diet. He is taking the Lipitor. No myalgias or other adverse effects. Today he says that he "has been feeling great!"   Past Medical History:  Diagnosis Date  . Diabetes mellitus without complication ([facility]  . Hyperlipidemia   . Hypertension      Home Meds:  Outpatient Medications Prior to Visit  Medication Sig Dispense Refill  . aspirin 81 MG tablet Take 1 tablet (81 mg total) by mouth daily. 30 tablet 11  . atorvastatin (LIPITOR) 10 MG tablet TAKE ONE TABLET BY MOUTH ONCE DAILY AT BEDTIME 30 tablet 0  . benazepril (LOTENSIN) 10 MG tablet TAKE ONE TABLET BY MOUTH ONCE DAILY 30 tablet 3  . Blood Glucose Monitoring Suppl (ONE TOUCH ULTRA SYSTEM KIT) W/DEVICE KIT 1 kit by Does not apply route once. 1 each 0  . Fluticasone Propionate (FLONASE ALLERGY RELIEF NA) Place 2 sprays into the nose daily. Buying OTC    . glucose blood test strip Check blood sugar each day fasting. 50 each 11  . Lancets (ONETOUCH ULTRASOFT) lancets Check blood sugar once daily fasting 100 each 6  . latanoprost (XALATAN) 0.005 % ophthalmic solution Place 1 drop into the left eye at bedtime.    . metFORMIN (GLUCOPHAGE) 500 MG tablet Take 2 tabs by mouth in the morning and 1 tab by mouth in the evening. 90 tablet 3  . Multiple Vitamins-Minerals (MULTIVITAMIN PO) Take by mouth.    . triamterene-hydrochlorothiazide (MAXZIDE-25) 37.5-25 MG tablet TAKE ONE TABLET BY MOUTH ONCE DAILY 90 tablet 0  . metFORMIN (GLUCOPHAGE) 500 MG tablet TAKE ONE TABLET BY MOUTH TWICE DAILY WITH A MEAL 180 tablet 0   No facility-administered medications prior to visit.      Allergies: No Known Allergies  Social History   Social History  . Marital status: Married    Spouse name: N/A  . Number of children: N/A  . Years of education: N/A   Occupational History  . Not on file.   Social History Main Topics  . Smoking status: Former Smoker    Quit date: 04/24/1966  . Smokeless tobacco: Never Used  . Alcohol use Yes    . Drug use: No  . Sexual activity: Yes    Birth control/ protection: None   Other Topics Concern  . Not on file   Social History Narrative  . No narrative on file    Family History  Problem Relation Age of Onset  . Cancer Mother     ovarian  . Hypertension Father   . Stroke Father   . Diabetes Brother   . Heart disease Brother   . Stroke Brother      Review of Systems:  See HPI for  pertinent ROS. All other ROS negative.    Physical Exam: Blood pressure 128/74, pulse 74, temperature 98 F (36.7 C), temperature source Oral, resp. rate 18, weight 172 lb (78 kg)., Body mass index is 23.99 kg/m. General: WNWD AAM. Appears in no acute distress. Neck: Supple. No thyromegaly. No lymphadenopathy. No carotid bruits. Lungs: Clear bilaterally to auscultation without wheezes, rales, or rhonchi. Breathing is unlabored. Heart: RRR with S1 S2. No murmurs, rubs, or gallops. Abdomen: Soft, non-tender, non-distended with normoactive bowel sounds. No hepatomegaly. No rebound/guarding. No obvious abdominal masses. Musculoskeletal:  Strength and tone normal for age. Extremities/Skin: Warm and dry. Neuro: Alert and oriented X 3. Moves all extremities spontaneously. Gait is normal. CNII-XII grossly in tact. Psych:  Responds to questions appropriately with a normal affect. Diabetic foot exam: Inspection is normal. No other wounds and no calluses or concerning areas. Sensation intact throughout. He has 2+ dorsalis pedis and posterior tibial pulses bilaterally.      ASSESSMENT AND PLAN:  75 y.o. year old male with    Diabetes mellitus without complication  - Hemoglobin A1c  MicroAlbumin----Done 06/01/2015, 06/16/2016  At prior OV he reported he had not had an eye exam in almost 15 years. We had discussed need for diabetic eye exam.   at prior OV.  At [place] --02/10/14--He reported that he did see Eye Doctor.  Has Glaucoma Left Eye. Has Cataract Right Eye. Also says he is getting glasses. At [place]  2/16 reports had recent cataract surgery. Says last Eye Exam was 10/2014--Pt reports there was no diabetic retinopathy/ diabetic eye problems.   At [place] 12/02/15 he states that he did have another diabetic eye exam this year--January 2017  Diabetic Foot Exam: Documented in Quality Metrics 06/01/2015  He is on statin. He is now on ACE Inhibitor-- benazepril 41m QD.  He is now on Aspirin 841m     Hyperlipidemia Lipids excellent and we actually had him decrease his Lipitor to half of a pill on Monday Wednesday Friday only and he is taking this dose. Had FLP/LFT 11/2014--LDL 36. HDL 56----Excellent Had FLP/LFT 06/01/15--LDL 60. --Excellent Had FLP/LFT 12/2015---LDL 50--excellent  Hypertension He is now on ACE inhibitor. Blood Pressure at goal. CMET--nml 11/2014, 06/01/15, 12/2015  GERD At [place] 11/2014--He was to stop eating and drinking several hours prior to going to bed.  At [place]/2016 he reports he did make this change and symptoms have resolved.   06/01/2015---He had CPE/ Visit for preventive health examination --Had Screening Labs--All normal. - CBC with Differential/Platelet----------------------------Normal - COMPLETE METABOLIC PANEL WITH GFR------Normal - Lipid panel-----------------------------------------------------Excellent - TSH-------------------------------------------------------------Normal - Vit D  25 hydroxy (rtn osteoporosis monitoring)-------Normal @ 48 . Screening for colorectal cancer - Fecal occult blood, imunochemical; Future------- he did these and these were negative 3 - Fecal occult blood, imunochemical; Future - Fecal occult blood, imunochemical; Future    Screening for colorectal cancer At prior office visit we discussed the fact that his last colonoscopy was 10 years ago.  [place] I  asked him about this and he said he called his insurance and they were going to cover 80% but not the full cost. He stated that he cannot afford this. He was agreeable to do  Hemoccults ---He did turn in Hemoccult x3 ---- they were negative x3.---10/31/2013 06/01/2015--He is agreeable to do another set of HemeOccult Cards----------- he did these and these were negative 3-------06/2015  Prostate Cancer Screening: No further screening indicated as he is now age 75 Immunizations:  Influenza vaccine: Tetanus:  This was updated at our visit 07/25/13 Pneumonia vaccine: He was given Pneumovax 23 here-- 07/25/13.     PREVNAR 13---given here 08/14/2014 Zostavax: Discussed this at the last visit. He says that he checked and his cost is going to be $95 and he defers.  Routine Office visit 3 months or sooner if needed.    [address] [place], [place], [facility] 06/16/2016 9:25 AM

## 2016-06-17 ENCOUNTER — Telehealth: Payer: Self-pay

## 2016-06-17 LAB — MICROALBUMIN, URINE: MICROALB UR: 0.2 mg/dL

## 2016-06-17 NOTE — Telephone Encounter (Signed)
Letter sent regarding labs

## 2016-07-13 ENCOUNTER — Other Ambulatory Visit: Payer: Self-pay

## 2016-07-13 MED ORDER — ONETOUCH ULTRASOFT LANCETS MISC: 2 refills | Status: DC

## 2016-07-13 MED ORDER — GLUCOSE BLOOD VI STRP: ORAL_STRIP | 2 refills | Status: DC

## 2016-07-13 NOTE — Telephone Encounter (Signed)
RX refilled per protocol 

## 2016-07-20 ENCOUNTER — Other Ambulatory Visit: Payer: Self-pay | Admitting: Physician Assistant

## 2016-07-20 NOTE — Telephone Encounter (Signed)
Rx refilled per protocol 

## 2016-08-04 ENCOUNTER — Other Ambulatory Visit: Payer: Self-pay

## 2016-08-04 MED ORDER — ATORVASTATIN CALCIUM 10 MG PO TABS: 10.0000 mg | ORAL_TABLET | Freq: Every day | ORAL | 0 refills | Status: DC

## 2016-08-04 NOTE — Telephone Encounter (Signed)
Rx refilled per protocol 

## 2016-09-05 ENCOUNTER — Other Ambulatory Visit: Payer: Self-pay | Admitting: Physician Assistant

## 2016-09-05 NOTE — Telephone Encounter (Signed)
Rx filled per protocol  

## 2016-09-15 ENCOUNTER — Encounter: Payer: Self-pay | Admitting: Physician Assistant

## 2016-09-15 ENCOUNTER — Ambulatory Visit (INDEPENDENT_AMBULATORY_CARE_PROVIDER_SITE_OTHER): Payer: Medicare Other | Admitting: Physician Assistant

## 2016-09-15 VITALS — BP 122/70 | HR 62 | Temp 97.7°F | Resp 18 | Wt 170.0 lb

## 2016-09-15 DIAGNOSIS — I10 Essential (primary) hypertension: Secondary | ICD-10-CM | POA: Diagnosis not present

## 2016-09-15 DIAGNOSIS — E119 Type 2 diabetes mellitus without complications: Secondary | ICD-10-CM | POA: Diagnosis not present

## 2016-09-15 DIAGNOSIS — Z1212 Encounter for screening for malignant neoplasm of rectum: Secondary | ICD-10-CM | POA: Diagnosis not present

## 2016-09-15 DIAGNOSIS — Z1211 Encounter for screening for malignant neoplasm of colon: Secondary | ICD-10-CM | POA: Diagnosis not present

## 2016-09-15 DIAGNOSIS — K219 Gastro-esophageal reflux disease without esophagitis: Secondary | ICD-10-CM

## 2016-09-15 DIAGNOSIS — E785 Hyperlipidemia, unspecified: Secondary | ICD-10-CM

## 2016-09-15 LAB — HEMOGLOBIN A1C, FINGERSTICK: HEMOGLOBIN A1C, FINGERSTICK: 6.6 % — AB (ref <5.7)

## 2016-09-15 NOTE — Progress Notes (Signed)
Patient ID: Aaron Mcfarland MRN: [id number], DOB: January 03, 1941, 75 y.o. Date of Encounter: _0 @  Chief Complaint:  Chief Complaint  Patient presents with  . Hypertension    f/u    HPI: 75 y.o. year old AA male  presents for above.  I saw him as a new patient to our office on 04/24/13. At that time he reported that his prior PCP, who was in [place], had retired 3 months prior. He reported that his last visit there was April 2014. He reported that his wife, Aaron Mcfarland, recently became a patient at this office and had been pleased. She recommended that he come to our office also since his PCP retired. He worked at [employer]. Played softball until age 75!!! 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  Depending on the weather--he either --- walks 40 minutes twice a day. He does this walking with his with his wife----OR------he and his wife walk once a day around 2 PM. Then he also does elliptical and bicycle for 15 minutes per day and uses a rowing machine.  Does exercise 5 days per week.  He checks his blood pressure every day and gets good readings. He was checking his blood sugar every morning and was getting between 90-120. I told him he did not have to cont to check every day--he has decreased to checking twice a week--gets around 110-130.   At prior lab we have told him to cut his Lipitor dose in half ---and only take Mon/Wed/Fri--- he says that he is taking this with these directions.    As well at last lab we have told him to cut his metformin down in half from 1000 twice a day to 500 twice a day. He also reports that he did make this change.  Also at last office visit I added aspirin 81 mg daily. He states that he did add this medication and is taking it as directed.  Taking  all medications as directed with no adverse effects.  At [place] 11/2014-- he reported that at night he felt acid come up in his throat.  Never happened  during the day. Only at night.  Was eaating "dinner "  At 6 pm.  Ate a sandwich at 10:00. Goes to bed at midnight.  When in bed, was feeling this acid. Often he was woken several hours after going to bed with acid coming up in his throat.  At [place] 02/2015 he reports he stopped eating the sandwich at 10pm. "I just eat enough at dinner that I'm not hungry later, now"---reflux has stopped.      At [place] 12/02/2015:  He reports the following: Says that after the antibiotics I prescribed last time, his symptoms cleared and stayed resolved for 2 weeks. However, after that, started developing symptoms again. Says that he feels drainage in his throat. It feels like it is coming down from his nose. Says that when he blows his nose it is watery clear. Says that there is no thick dark mucus. He is not feeling any congestion in his chest. Has had no fevers or chills. Says he has been using Equate brand medicine similar to mucinex-DM--says has guafennisin.  Discussed with him that I see Zyrtec on his medication list and asked if he is currently taking this. Replies that he only takes this as needed for seasonal allergies --that he has allergy to [facility] and starts naming other  specific allergies. No other complaints or concerns at this visit today. Diabetic medications as directed with no adverse effects. In his Lipitor as directed above with no adverse effects.  OV 03/14/2016: At [place] 12/2015 A1C was slightly high and had been creeping up. At that time -- increased Metformin from 578m 1 BID to 2 in am and 1 pm.  At OV 03/14/2016--he says he did increase / adjust the dose and is taking it correctly/as directed.  Checks BS ~ twice a week. Getting good readings. 93 this morning.  Taking Lipitor 176m No myalgias or other adverse effects.   OV 09/15/2016: He is taking diabetic medications as directed. He still checks blood sugar about twice a week. Continuing to get good readings. He continues to stay very physically active  and is very compliant with diet. Verified that he is taking metformin 2 in the morning and 1 in the evening. This is causing no adverse effects. He is taking the Lipitor. No myalgias or other adverse effects. He is taking blood pressure medications as directed. No lightheadedness or other adverse effects. Today he says that he "has been feeling great!"   Past Medical History:  Diagnosis Date  . Diabetes mellitus without complication ([facility]  . Hyperlipidemia   . Hypertension      Home Meds:  Outpatient Medications Prior to Visit  Medication Sig Dispense Refill  . aspirin 81 MG tablet Take 1 tablet (81 mg total) by mouth daily. 30 tablet 11  . atorvastatin (LIPITOR) 10 MG tablet TAKE ONE TABLET BY MOUTH ONCE DAILY WITH BREAKFAST 30 tablet 1  . benazepril (LOTENSIN) 10 MG tablet TAKE ONE TABLET BY MOUTH ONCE DAILY 30 tablet 3  . Blood Glucose Monitoring Suppl (ONE TOUCH ULTRA SYSTEM KIT) W/DEVICE KIT 1 kit by Does not apply route once. 1 each 0  . Fluticasone Propionate (FLONASE ALLERGY RELIEF NA) Place 2 sprays into the nose daily. Buying OTC    . glucose blood test strip Check blood sugar each day fasting. 50 each 2  . Lancets (ONETOUCH ULTRASOFT) lancets Check blood sugar once daily fasting 100 each 2  . latanoprost (XALATAN) 0.005 % ophthalmic solution Place 1 drop into the left eye at bedtime.    . metFORMIN (GLUCOPHAGE) 500 MG tablet Take 2 tabs by mouth in the morning and 1 tab by mouth in the evening. 90 tablet 3  . Multiple Vitamins-Minerals (MULTIVITAMIN PO) Take by mouth.    . triamterene-hydrochlorothiazide (MAXZIDE-25) 37.5-25 MG tablet TAKE ONE TABLET BY MOUTH ONCE DAILY 90 tablet 0  . metFORMIN (GLUCOPHAGE) 500 MG tablet TAKE ONE TABLET BY MOUTH TWICE DAILY WITH A MEAL 180 tablet 0   No facility-administered medications prior to visit.      Allergies: No Known Allergies  Social History   Social History  . Marital status: Married    Spouse name: N/A  . Number of  children: N/A  . Years of education: N/A   Occupational History  . Not on file.   Social History Main Topics  . Smoking status: Former Smoker    Quit date: 04/24/1966  . Smokeless tobacco: Never Used  . Alcohol use Yes  . Drug use: No  . Sexual activity: Yes    Birth control/ protection: None   Other Topics Concern  . Not on file   Social History Narrative  . No narrative on file    Family History  Problem Relation Age of Onset  . Cancer Mother  ovarian  . Hypertension Father   . Stroke Father   . Diabetes Brother   . Heart disease Brother   . Stroke Brother      Review of Systems:  See HPI for pertinent ROS. All other ROS negative.    Physical Exam: Blood pressure 122/70, pulse 62, temperature 97.7 F (36.5 C), temperature source Oral, resp. rate 18, weight 170 lb (77.1 kg)., Body mass index is 23.71 kg/m. General: WNWD AAM. Appears in no acute distress. Neck: Supple. No thyromegaly. No lymphadenopathy. No carotid bruits. Lungs: Clear bilaterally to auscultation without wheezes, rales, or rhonchi. Breathing is unlabored. Heart: RRR with S1 S2. No murmurs, rubs, or gallops. Abdomen: Soft, non-tender, non-distended with normoactive bowel sounds. No hepatomegaly. No rebound/guarding. No obvious abdominal masses. Musculoskeletal:  Strength and tone normal for age. Extremities/Skin: Warm and dry. Neuro: Alert and oriented X 3. Moves all extremities spontaneously. Gait is normal. CNII-XII grossly in tact. Psych:  Responds to questions appropriately with a normal affect. Diabetic foot exam: Inspection is normal. No other wounds and no calluses or concerning areas. Sensation intact throughout. He has 2+ dorsalis pedis and posterior tibial pulses bilaterally.      ASSESSMENT AND PLAN:  75 y.o. year old male with    Diabetes mellitus without complication  - Hemoglobin A1c  MicroAlbumin----Done 06/01/2015, 06/16/2016  At prior OV he reported he had not had an eye  exam in almost 15 years. We had discussed need for diabetic eye exam.   at prior OV.  At [place] --02/10/14--He reported that he did see Eye Doctor.  Has Glaucoma Left Eye. Has Cataract Right Eye. Also says he is getting glasses. At [place] 2/16 reports had recent cataract surgery. Says last Eye Exam was 10/2014--Pt reports there was no diabetic retinopathy/ diabetic eye problems.   At [place] 12/02/15 he states that he did have another diabetic eye exam this year--January 2017  Diabetic Foot Exam: normal  He is on statin. He is now on ACE Inhibitor-- benazepril 55m QD.  He is now on Aspirin 847m     Hyperlipidemia Lipids excellent and we actually had him decrease his Lipitor to half of a pill on Monday Wednesday Friday only and he is taking this dose. Had FLP/LFT 11/2014--LDL 36. HDL 56----Excellent Had FLP/LFT 06/01/15--LDL 60. --Excellent Had FLP/LFT 12/2015---LDL 50--excellent Had FLP/LFT 06/2016----Excellent  Hypertension He is now on ACE inhibitor. Blood Pressure at goal. CMET--nml 11/2014, 06/01/15, 12/2015  GERD At [place] 11/2014--He was to stop eating and drinking several hours prior to going to bed.  [place] he reports he did make this change and symptoms have resolved.   06/01/2015---He had CPE/ Visit for preventive health examination --Had Screening Labs--All normal. - CBC with Differential/Platelet----------------------------Normal - COMPLETE METABOLIC PANEL WITH GFR------Normal - Lipid panel-----------------------------------------------------Excellent - TSH-------------------------------------------------------------Normal - Vit D  25 hydroxy (rtn osteoporosis monitoring)-------Normal @ 48 . Screening for colorectal cancer - Fecal occult blood, imunochemical; Future------- he did these and these were negative 3 - Fecal occult blood, imunochemical; Future - Fecal occult blood, imunochemical; Future    Screening for colorectal cancer At prior office visit we discussed the fact that  his last colonoscopy was 10 years ago.  [place] I  asked him about this and he said he called his insurance and they were going to cover 80% but not the full cost. He stated that he cannot afford this. He was agreeable to do Hemoccults ---He did turn in Hemoccult x3 ---- they were negative x3.---10/31/2013 06/01/2015--He  is agreeable to do another set of HemeOccult Cards----------- he did these and these were negative 3-------06/2015  Prostate Cancer Screening: No further screening indicated as he is now age 40.  Immunizations:  Influenza vaccine: 06/2016 Tetanus: This was updated at our visit 07/25/13 Pneumonia vaccine: He was given Pneumovax 23 here-- 07/25/13.     PREVNAR 13---given here 08/14/2014 Zostavax: Discussed this at the last visit. He says that he checked and his cost is going to be $95 and he defers.  Routine Office visit 3 months or sooner if needed.    Signed, [address] [place], [place], [facility] 09/15/2016 9:17 AM

## 2016-09-21 ENCOUNTER — Other Ambulatory Visit: Payer: Self-pay | Admitting: Physician Assistant

## 2016-11-15 ENCOUNTER — Other Ambulatory Visit: Payer: Self-pay | Admitting: Physician Assistant

## 2016-11-15 NOTE — Telephone Encounter (Signed)
Refill appropriate 

## 2016-12-13 ENCOUNTER — Other Ambulatory Visit: Payer: Self-pay | Admitting: Physician Assistant

## 2016-12-14 ENCOUNTER — Encounter: Payer: Self-pay | Admitting: Physician Assistant

## 2016-12-14 ENCOUNTER — Ambulatory Visit (INDEPENDENT_AMBULATORY_CARE_PROVIDER_SITE_OTHER): Payer: Medicare HMO | Admitting: Physician Assistant

## 2016-12-14 VITALS — BP 130/78 | HR 102 | Temp 97.9°F | Resp 16 | Wt 175.8 lb

## 2016-12-14 DIAGNOSIS — K219 Gastro-esophageal reflux disease without esophagitis: Secondary | ICD-10-CM

## 2016-12-14 DIAGNOSIS — I10 Essential (primary) hypertension: Secondary | ICD-10-CM

## 2016-12-14 DIAGNOSIS — Z1212 Encounter for screening for malignant neoplasm of rectum: Secondary | ICD-10-CM

## 2016-12-14 DIAGNOSIS — Z1211 Encounter for screening for malignant neoplasm of colon: Secondary | ICD-10-CM | POA: Diagnosis not present

## 2016-12-14 DIAGNOSIS — E119 Type 2 diabetes mellitus without complications: Secondary | ICD-10-CM | POA: Diagnosis not present

## 2016-12-14 DIAGNOSIS — E785 Hyperlipidemia, unspecified: Secondary | ICD-10-CM

## 2016-12-14 LAB — COMPLETE METABOLIC PANEL WITH GFR
ALT: 16 U/L (ref 9–46)
AST: 19 U/L (ref 10–35)
Albumin: 4.5 g/dL (ref 3.6–5.1)
Alkaline Phosphatase: 67 U/L (ref 40–115)
BILIRUBIN TOTAL: 0.7 mg/dL (ref 0.2–1.2)
BUN: 17 mg/dL (ref 7–25)
CO2: 27 mmol/L (ref 20–31)
CREATININE: 1.16 mg/dL (ref 0.70–1.18)
Calcium: 10.3 mg/dL (ref 8.6–10.3)
Chloride: 102 mmol/L (ref 98–110)
GFR, EST AFRICAN AMERICAN: 70 mL/min (ref >=60)
GFR, Est Non African American: 61 mL/min (ref >=60)
Glucose, Bld: 155 mg/dL — ABNORMAL HIGH (ref 70–99)
Potassium: 4.7 mmol/L (ref 3.5–5.3)
Sodium: 138 mmol/L (ref 135–146)
TOTAL PROTEIN: 7.9 g/dL (ref 6.1–8.1)

## 2016-12-14 LAB — LIPID PANEL
CHOLESTEROL: 161 mg/dL (ref <200)
HDL: 70 mg/dL (ref >40)
LDL CALC: 73 mg/dL (ref <100)
TRIGLYCERIDES: 89 mg/dL (ref <150)
Total CHOL/HDL Ratio: 2.3 Ratio (ref <5.0)
VLDL: 18 mg/dL (ref <30)

## 2016-12-14 NOTE — Progress Notes (Signed)
Patient ID: Aaron Mcfarland MRN: [id number], DOB: 02-05-1941, 76 y.o. Date of Encounter: _0 @  Chief Complaint:  Chief Complaint  Patient presents with  . Hypertension    3 month f/u  . Diabetes    HPI: 76 y.o. year old AA male  presents for above.  I saw him as a new patient to our office on 04/24/13. At that time he reported that his prior PCP, who was in [place], had retired 3 months prior. He reported that his last visit there was April 2014. He reported that his wife, Aaron Mcfarland, recently became a patient at this office and had been pleased. She recommended that he come to our office also since his PCP retired. He worked at [employer]. Played softball until age 1!!! 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  Depending on the weather--he either --- walks 40 minutes twice a day. He does this walking with his with his wife----OR------he and his wife walk once a day around 2 PM. Then he also does elliptical and bicycle for 15 minutes per day and uses a rowing machine.  Does exercise 5 days per week.  He checks his blood pressure every day and gets good readings. He was checking his blood sugar every morning and was getting between 90-120. I told him he did not have to cont to check every day--he has decreased to checking twice a week--gets around 110-130.   At prior lab we have told him to cut his Lipitor dose in half ---and only take Mon/Wed/Fri--- he says that he is taking this with these directions.    As well at last lab we have told him to cut his metformin down in half from 1000 twice a day to 500 twice a day. He also reports that he did make this change.  Also at last office visit I added aspirin 81 mg daily. He states that he did add this medication and is taking it as directed.  Taking  all medications as directed with no adverse effects.  At [place] 11/2014-- he reported that at night he felt acid come up in his  throat.  Never happened during the day. Only at night.  Was eaating "dinner "  At 6 pm.  Ate a sandwich at 10:00. Goes to bed at midnight.  When in bed, was feeling this acid. Often he was woken several hours after going to bed with acid coming up in his throat.  At [place] 02/2015 he reports he stopped eating the sandwich at 10pm. "I just eat enough at dinner that I'm not hungry later, now"---reflux has stopped.      At [place] 12/02/2015:  He reports the following: Says that after the antibiotics I prescribed last time, his symptoms cleared and stayed resolved for 2 weeks. However, after that, started developing symptoms again. Says that he feels drainage in his throat. It feels like it is coming down from his nose. Says that when he blows his nose it is watery clear. Says that there is no thick dark mucus. He is not feeling any congestion in his chest. Has had no fevers or chills. Says he has been using Equate brand medicine similar to mucinex-DM--says has guafennisin.  Discussed with him that I see Zyrtec on his medication list and asked if he is currently taking this. Replies that he only takes this as needed for seasonal allergies --that he has allergy to  [place] and starts naming other specific allergies. No other complaints or concerns at this visit today. Diabetic medications as directed with no adverse effects. In his Lipitor as directed above with no adverse effects.  OV 77/09/2016: At [place] 12/2015 A1C was slightly high and had been creeping up. At that time -- increased Metformin from 541m 1 BID to 2 in am and 1 pm.  At OV 03/14/2016--he says he did increase / adjust the dose and is taking it correctly/as directed.  Checks BS ~ twice a week. Getting good readings. 93 this morning.  Taking Lipitor 158m No myalgias or other adverse effects.   OV 12/14/2016: He is taking diabetic medications as directed. He still checks blood sugar about twice a week. Continuing to get good readings. He continues to  stay very physically active and is very compliant with diet. Verified that he is taking metformin 2 in the morning and 1 in the evening. This is causing no adverse effects. He is taking the Lipitor. No myalgias or other adverse effects. He is taking blood pressure medications as directed. No lightheadedness or other adverse effects. Today he says that he "has been feeling great!"   Past Medical History:  Diagnosis Date  . Diabetes mellitus without complication ([facility]  . Hyperlipidemia   . Hypertension      Home Meds:  Outpatient Medications Prior to Visit  Medication Sig Dispense Refill  . aspirin 81 MG tablet Take 1 tablet (81 mg total) by mouth daily. 30 tablet 11  . atorvastatin (LIPITOR) 10 MG tablet TAKE ONE TABLET BY MOUTH ONCE DAILY WITH BREAKFAST 30 tablet 1  . benazepril (LOTENSIN) 10 MG tablet TAKE ONE TABLET BY MOUTH ONCE DAILY 30 tablet 3  . Blood Glucose Monitoring Suppl (ONE TOUCH ULTRA SYSTEM KIT) W/DEVICE KIT 1 kit by Does not apply route once. 1 each 0  . Fluticasone Propionate (FLONASE ALLERGY RELIEF NA) Place 2 sprays into the nose daily. Buying OTC    . glucose blood test strip Check blood sugar each day fasting. 50 each 2  . Lancets (ONETOUCH ULTRASOFT) lancets Check blood sugar once daily fasting 100 each 2  . latanoprost (XALATAN) 0.005 % ophthalmic solution Place 1 drop into the left eye at bedtime.    . metFORMIN (GLUCOPHAGE) 500 MG tablet Take 2 tabs by mouth in the morning and 1 tab by mouth in the evening. 90 tablet 3  . metFORMIN (GLUCOPHAGE) 500 MG tablet TAKE ONE TABLET BY MOUTH TWICE DAILY WITH A MEAL 60 tablet 0  . Multiple Vitamins-Minerals (MULTIVITAMIN PO) Take by mouth.    . triamterene-hydrochlorothiazide (MAXZIDE-25) 37.5-25 MG tablet TAKE 1 TABLET BY MOUTH ONCE DAILY 30 tablet 0   No facility-administered medications prior to visit.      Allergies: No Known Allergies  Social History   Social History  . Marital status: Married    Spouse  name: N/A  . Number of children: N/A  . Years of education: N/A   Occupational History  . Not on file.   Social History Main Topics  . Smoking status: Former Smoker    Quit date: 04/24/1966  . Smokeless tobacco: Never Used  . Alcohol use Yes  . Drug use: No  . Sexual activity: Yes    Birth control/ protection: None   Other Topics Concern  . Not on file   Social History Narrative  . No narrative on file    Family History  Problem Relation Age of Onset  . Cancer  Mother     ovarian  . Hypertension Father   . Stroke Father   . Diabetes Brother   . Heart disease Brother   . Stroke Brother      Review of Systems:  See HPI for pertinent ROS. All other ROS negative.    Physical Exam: Blood pressure 130/78, pulse (!) 102, temperature 97.9 F (36.6 C), temperature source Oral, resp. rate 16, weight 175 lb 12.8 oz (79.7 kg), SpO2 95 %., Body mass index is 24.52 kg/m. General: WNWD AAM. Appears in no acute distress. Neck: Supple. No thyromegaly. No lymphadenopathy. No carotid bruits. Lungs: Clear bilaterally to auscultation without wheezes, rales, or rhonchi. Breathing is unlabored. Heart: RRR with S1 S2. No murmurs, rubs, or gallops. Abdomen: Soft, non-tender, non-distended with normoactive bowel sounds. No hepatomegaly. No rebound/guarding. No obvious abdominal masses. Musculoskeletal:  Strength and tone normal for age. Extremities/Skin: Warm and dry. Neuro: Alert and oriented X 3. Moves all extremities spontaneously. Gait is normal. CNII-XII grossly in tact. Psych:  Responds to questions appropriately with a normal affect. Diabetic foot exam: Inspection is normal. No other wounds and no calluses or concerning areas. Sensation intact throughout. He has 2+ dorsalis pedis and posterior tibial pulses bilaterally.      ASSESSMENT AND PLAN:  76 y.o. year old male with    Diabetes mellitus without complication  8/92/1194: Check A1C, CMET, FLP  MicroAlbumin----Done  06/01/2015, 06/16/2016  At prior OV he reported he had not had an eye exam in almost 15 years. We had discussed need for diabetic eye exam.   at prior OV.  At [place] --02/10/14--He reported that he did see Eye Doctor.  Has Glaucoma Left Eye. Has Cataract Right Eye. Also says he is getting glasses. At [place] 2/16 reports had recent cataract surgery. Says last Eye Exam was 10/2014--Pt reports there was no diabetic retinopathy/ diabetic eye problems.   At [place] 12/02/15 he states that he did have another diabetic eye exam this year--January 2017  Diabetic Foot Exam: normal  He is on statin. He is now on ACE Inhibitor-- benazepril 76m QD.  He is now on Aspirin 882m     Hyperlipidemia Lipids excellent and we actually had him decrease his Lipitor to half of a pill on Monday Wednesday Friday only and he is taking this dose. Had FLP/LFT 11/2014--LDL 36. HDL 56----Excellent Had FLP/LFT 06/01/15--LDL 60. --Excellent Had FLP/LFT 12/2015---LDL 50--excellent Had FLP/LFT 06/2016----Excellent 12/14/2016: Recheck FLP/LFT  Hypertension He is now on ACE inhibitor. 12/14/2016: He is on ACE Inh. Blood Pressure at goal. Check lab to monitor.   GERD At [place] 11/2014--He was to stop eating and drinking several hours prior to going to bed.  [place] he reports he did make this change and symptoms have resolved.   06/01/2015---He had CPE/ Visit for preventive health examination --Had Screening Labs--All normal. - CBC with Differential/Platelet----------------------------Normal - COMPLETE METABOLIC PANEL WITH GFR------Normal - Lipid panel-----------------------------------------------------Excellent - TSH-------------------------------------------------------------Normal - Vit D  25 hydroxy (rtn osteoporosis monitoring)-------Normal @ 48 . Screening for colorectal cancer - Fecal occult blood, imunochemical; Future------- he did these and these were negative 3 - Fecal occult blood, imunochemical; Future - Fecal occult  blood, imunochemical; Future    Screening for colorectal cancer At prior office visit we discussed the fact that his last colonoscopy was 10 years ago.  [place] I  asked him about this and he said he called his insurance and they were going to cover 80% but not the full cost. He stated  that he cannot afford this. He was agreeable to do Hemoccults ---He did turn in Hemoccult x3 ---- they were negative x3.---10/31/2013 06/01/2015--He is agreeable to do another set of HemeOccult Cards----------- he did these and these were negative 3-------06/2015  Prostate Cancer Screening: No further screening indicated as he is now age 73.  Immunizations:  Influenza vaccine: 06/2016 Tetanus: This was updated at our visit 07/25/13 Pneumonia vaccine: He was given Pneumovax 23 here-- 07/25/13.     PREVNAR 13---given here 08/14/2014 Zostavax: Discussed this at the last visit. He says that he checked and his cost is going to be $95 and he defers.  Routine Office visit 3 months or sooner if needed.    [address] [place], [place], [facility] 12/14/2016 8:35 AM

## 2016-12-15 LAB — HEMOGLOBIN A1C
Hgb A1c MFr Bld: 5.9 % — ABNORMAL HIGH (ref <5.7)
MEAN PLASMA GLUCOSE: 123 mg/dL

## 2016-12-16 ENCOUNTER — Other Ambulatory Visit: Payer: Self-pay

## 2016-12-19 ENCOUNTER — Other Ambulatory Visit: Payer: Self-pay | Admitting: Physician Assistant

## 2016-12-19 NOTE — Telephone Encounter (Signed)
Refill appropriate 

## 2016-12-20 ENCOUNTER — Other Ambulatory Visit: Payer: Self-pay

## 2016-12-20 MED ORDER — METFORMIN HCL 500 MG PO TABS: ORAL_TABLET | ORAL | 3 refills | Status: DC

## 2017-01-19 ENCOUNTER — Other Ambulatory Visit: Payer: Self-pay | Admitting: Physician Assistant

## 2017-02-07 ENCOUNTER — Other Ambulatory Visit: Payer: Self-pay | Admitting: Physician Assistant

## 2017-02-07 ENCOUNTER — Other Ambulatory Visit: Payer: Self-pay

## 2017-02-07 MED ORDER — BENAZEPRIL HCL 10 MG PO TABS
10.0000 mg | ORAL_TABLET | Freq: Every day | ORAL | 0 refills | Status: DC
Start: 2017-02-07 — End: 2017-03-22

## 2017-02-07 NOTE — Telephone Encounter (Signed)
Refill appropriate 

## 2017-02-28 ENCOUNTER — Other Ambulatory Visit: Payer: Self-pay | Admitting: Physician Assistant

## 2017-02-28 NOTE — Telephone Encounter (Signed)
Refill appropriate 

## 2017-03-07 ENCOUNTER — Other Ambulatory Visit: Payer: Self-pay

## 2017-03-07 MED ORDER — ATORVASTATIN CALCIUM 10 MG PO TABS: ORAL_TABLET | ORAL | 1 refills | Status: DC

## 2017-03-07 NOTE — Telephone Encounter (Signed)
Refill appropriate 

## 2017-03-22 ENCOUNTER — Other Ambulatory Visit: Payer: Self-pay

## 2017-03-22 ENCOUNTER — Ambulatory Visit (INDEPENDENT_AMBULATORY_CARE_PROVIDER_SITE_OTHER): Payer: Medicare HMO | Admitting: Physician Assistant

## 2017-03-22 ENCOUNTER — Encounter: Payer: Self-pay | Admitting: Physician Assistant

## 2017-03-22 VITALS — BP 128/70 | HR 70 | Temp 97.8°F | Resp 16 | Ht 71.0 in | Wt 171.2 lb

## 2017-03-22 DIAGNOSIS — Z1211 Encounter for screening for malignant neoplasm of colon: Secondary | ICD-10-CM

## 2017-03-22 DIAGNOSIS — E785 Hyperlipidemia, unspecified: Secondary | ICD-10-CM | POA: Diagnosis not present

## 2017-03-22 DIAGNOSIS — E119 Type 2 diabetes mellitus without complications: Secondary | ICD-10-CM | POA: Diagnosis not present

## 2017-03-22 DIAGNOSIS — I10 Essential (primary) hypertension: Secondary | ICD-10-CM | POA: Diagnosis not present

## 2017-03-22 DIAGNOSIS — Z1212 Encounter for screening for malignant neoplasm of rectum: Secondary | ICD-10-CM | POA: Diagnosis not present

## 2017-03-22 LAB — HEMOGLOBIN A1C, FINGERSTICK: HEMOGLOBIN A1C, FINGERSTICK: 5.9 % — AB (ref <5.7)

## 2017-03-22 MED ORDER — METFORMIN HCL 500 MG PO TABS: ORAL_TABLET | ORAL | 3 refills | Status: DC

## 2017-03-22 NOTE — Progress Notes (Addendum)
Patient ID: Aaron Mcfarland MRN: [id number], DOB: Nov 01, 1940, 76 y.o. Date of Encounter: _0 @  Chief Complaint:  Chief Complaint  Patient presents with  . 3 month f/u    HPI: 76 y.o. year old AA male  presents for above.  I saw him as a new patient to our office on 04/24/13. At that time he reported that his prior PCP, who was in [place], had retired 3 months prior. He reported that his last visit there was April 2014. He reported that his wife, Aaron Mcfarland, recently became a patient at this office and had been pleased. She recommended that he come to our office also since his PCP retired. He worked at [employer]. Played softball until age 6!!! 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  Depending on the weather--he either --- walks 40 minutes twice a day. He does this walking with his with his wife----OR------he and his wife walk once a day around 2 PM. Then he also does elliptical and bicycle for 15 minutes per day and uses a rowing machine.  Does exercise 5 days per week.  He checks his blood pressure every day and gets good readings. He was checking his blood sugar every morning and was getting between 90-120. I told him he did not have to cont to check every day--he has decreased to checking twice a week--gets around 110-130.   At prior lab we have told him to cut his Lipitor dose in half ---and only take Mon/Wed/Fri--- he says that he is taking this with these directions.    As well at last lab we have told him to cut his metformin down in half from 1000 twice a day to 500 twice a day. He also reports that he did make this change.  Also at last office visit I added aspirin 81 mg daily. He states that he did add this medication and is taking it as directed.  Taking  all medications as directed with no adverse effects.  At [place] 11/2014-- he reported that at night he felt acid come up in his throat.  Never happened during the  day. Only at night.  Was eaating "dinner "  At 6 pm.  Ate a sandwich at 10:00. Goes to bed at midnight.  When in bed, was feeling this acid. Often he was woken several hours after going to bed with acid coming up in his throat.  At [place] 02/2015 he reports he stopped eating the sandwich at 10pm. "I just eat enough at dinner that I'm not hungry later, now"---reflux has stopped.      At [place] 12/02/2015:  He reports the following: Says that after the antibiotics I prescribed last time, his symptoms cleared and stayed resolved for 2 weeks. However, after that, started developing symptoms again. Says that he feels drainage in his throat. It feels like it is coming down from his nose. Says that when he blows his nose it is watery clear. Says that there is no thick dark mucus. He is not feeling any congestion in his chest. Has had no fevers or chills. Says he has been using Equate brand medicine similar to mucinex-DM--says has guafennisin.  Discussed with him that I see Zyrtec on his medication list and asked if he is currently taking this. Replies that he only takes this as needed for seasonal allergies --that he has allergy to [facility] and starts naming other specific allergies.  No other complaints or concerns at this visit today. Diabetic medications as directed with no adverse effects. In his Lipitor as directed above with no adverse effects.  OV 03/14/2016: At [place] 12/2015 A1C was slightly high and had been creeping up. At that time -- increased Metformin from 555m 1 BID to 2 in am and 1 pm.  At [place] 03/14/2016--he says he did increase / adjust the dose and is taking it correctly/as directed.  Checks BS ~ twice a week. Getting good readings. 93 this morning.  Taking Lipitor 163m No myalgias or other adverse effects.   OV 12/14/2016: He is taking diabetic medications as directed. He still checks blood sugar about twice a week. Continuing to get good readings. He continues to stay very physically active and is very  compliant with diet. Verified that he is taking metformin 2 in the morning and 1 in the evening. This is causing no adverse effects. He is taking the Lipitor. No myalgias or other adverse effects. He is taking blood pressure medications as directed. No lightheadedness or other adverse effects. Today he says that he "has been feeling great!"  03/22/2017: Again today, he says that he has been feeling great!!! He continues to stay very physically active and very compliant with his diet. He is taking diabetic medications as directed. Taking metformin 2 in the morning and 1 in the evening. This is causing no adverse effects. He continues to check blood sugar 2 times per week and is continuing to get good readings. He is taking the Lipitor. No myalgias or other adverse effects. He is taking his blood pressure medications as directed. No lightheadedness or other adverse effects. AT [place] 03/22/2017--CHECKING A1C, COLOGUARD, SHINGRIX---SEE A/P FOR DETAILS   Past Medical History:  Diagnosis Date  . Diabetes mellitus without complication ([facility]  . Hyperlipidemia   . Hypertension      Home Meds:  Outpatient Medications Prior to Visit  Medication Sig Dispense Refill  . aspirin 81 MG tablet Take 1 tablet (81 mg total) by mouth daily. 30 tablet 11  . atorvastatin (LIPITOR) 10 MG tablet TAKE ONE TABLET BY MOUTH ONCE DAILY WITH BREAKFAST 90 tablet 1  . benazepril (LOTENSIN) 10 MG tablet TAKE ONE TABLET BY MOUTH ONCE DAILY 30 tablet 3  . Blood Glucose Monitoring Suppl (ONE TOUCH ULTRA SYSTEM KIT) W/DEVICE KIT 1 kit by Does not apply route once. 1 each 0  . Fluticasone Propionate (FLONASE ALLERGY RELIEF NA) Place 2 sprays into the nose daily. Buying OTC    . glucose blood test strip Check blood sugar each day fasting. 50 each 2  . Lancets (ONETOUCH ULTRASOFT) lancets Check blood sugar once daily fasting 100 each 2  . latanoprost (XALATAN) 0.005 % ophthalmic solution Place 1 drop into the left eye at  bedtime.    . metFORMIN (GLUCOPHAGE) 500 MG tablet Take 2 tabs by mouth in the morning and 1 tab by mouth in the evening. 90 tablet 3  . Multiple Vitamins-Minerals (MULTIVITAMIN PO) Take by mouth.    . triamterene-hydrochlorothiazide (MAXZIDE-25) 37.5-25 MG tablet TAKE 1 TABLET BY MOUTH ONCE DAILY 90 tablet 0  . benazepril (LOTENSIN) 10 MG tablet Take 1 tablet (10 mg total) by mouth daily. 90 tablet 0   No facility-administered medications prior to visit.      Allergies: No Known Allergies  Social History   Social History  . Marital status: Married    Spouse name: N/A  . Number of children: N/A  . Years  of education: N/A   Occupational History  . Not on file.   Social History Main Topics  . Smoking status: Former Smoker    Quit date: 04/24/1966  . Smokeless tobacco: Never Used  . Alcohol use Yes  . Drug use: No  . Sexual activity: Yes    Birth control/ protection: None   Other Topics Concern  . Not on file   Social History Narrative  . No narrative on file    Family History  Problem Relation Age of Onset  . Cancer Mother        ovarian  . Hypertension Father   . Stroke Father   . Diabetes Brother   . Heart disease Brother   . Stroke Brother      Review of Systems:  See HPI for pertinent ROS. All other ROS negative.    Physical Exam: Blood pressure 128/70, pulse 70, temperature 97.8 F (36.6 C), temperature source Oral, resp. rate 16, height 5' 11" (1.803 m), weight 171 lb 3.2 oz (77.7 kg), SpO2 99 %., Body mass index is 23.88 kg/m. General: WNWD AAM. Appears in no acute distress. Neck: Supple. No thyromegaly. No lymphadenopathy. No carotid bruits. Lungs: Clear bilaterally to auscultation without wheezes, rales, or rhonchi. Breathing is unlabored. Heart: RRR with S1 S2. No murmurs, rubs, or gallops. Abdomen: Soft, non-tender, non-distended with normoactive bowel sounds. No hepatomegaly. No rebound/guarding. No obvious abdominal masses. Musculoskeletal:   Strength and tone normal for age. Extremities/Skin: Warm and dry. Neuro: Alert and oriented X 3. Moves all extremities spontaneously. Gait is normal. CNII-XII grossly in tact. Psych:  Responds to questions appropriately with a normal affect. Diabetic foot exam: Inspection is normal. No other wounds and no calluses or concerning areas. Sensation intact throughout. He has 2+ dorsalis pedis and posterior tibial pulses bilaterally.      ASSESSMENT AND PLAN:  76 y.o. year old male with   AT [place] 03/22/2017--CHECKING A1C, COLOGUARD, SHINGRIX---SEE A/P FOR DETAILS   Diabetes mellitus without complication  2/59/5638: Check A1C.   ----If A1c is low again can probably decrease dose of metformin.  --He states that he has eye exam scheduled for August. MicroAlbumin----Done 06/01/2015, 06/16/2016  At prior OV he reported he had not had an eye exam in almost 15 years. We had discussed need for diabetic eye exam.   at prior OV.  At [place] --02/10/14--He reported that he did see Eye Doctor.  Has Glaucoma Left Eye. Has Cataract Right Eye. Also says he is getting glasses. At [place] 2/16 reports had recent cataract surgery. Says last Eye Exam was 10/2014--Pt reports there was no diabetic retinopathy/ diabetic eye problems.   At [place] 12/02/15 he states that he did have another diabetic eye exam this year--January 2017 At [place] 03/22/17 he reports that he has eye exam scheduled for August 2018.  Diabetic Foot Exam: normal  He is on statin. He is now on ACE Inhibitor-- benazepril 36m QD.  He is now on Aspirin 871m     Hyperlipidemia Lipids excellent and we actually had him decrease his Lipitor to half of a pill on Monday Wednesday Friday only and he is taking this dose. Had FLP/LFT 11/2014--LDL 36. HDL 56----Excellent Had FLP/LFT 06/01/15--LDL 60. --Excellent Had FLP/LFT 12/2015---LDL 50--excellent Had FLP/LFT 06/2016----Excellent 12/14/2016: Recheck FLP/LFT 03/22/17 he had lipid panel on 12/1416 which was excellent. HDL  70 and LDL 73. Continue current medication Lipitor.  Hypertension He is now on ACE inhibitor. 12/14/2016: He is on ACE Inh. Blood Pressure at  goal. Check lab to monitor. 03/22/17: Blood pressure is at goal. On ACE inhibitor. Lab was normal 12/14/16.  GERD At [place] 11/2014--He was to stop eating and drinking several hours prior to going to bed.  At [place] 02/2015 he reports he did make this change and symptoms have resolved.   06/01/2015---He had CPE/ Visit for preventive health examination --Had Screening Labs--All normal. - CBC with Differential/Platelet----------------------------Normal - COMPLETE METABOLIC PANEL WITH GFR------Normal - Lipid panel-----------------------------------------------------Excellent - TSH-------------------------------------------------------------Normal - Vit D  25 hydroxy (rtn osteoporosis monitoring)-------Normal @ 48 . Screening for colorectal cancer - Fecal occult blood, imunochemical; Future------- he did these and these were negative 3 - Fecal occult blood, imunochemical; Future - Fecal occult blood, imunochemical; Future    Screening for colorectal cancer At prior office visit we discussed the fact that his last colonoscopy was 10 years ago.  At [place] 10/2013 I  asked him about this and he said he called his insurance and they were going to cover 80% but not the full cost. He stated that he cannot afford this. He was agreeable to do Hemoccults ---He did turn in Hemoccult x3 ---- they were negative x3.---10/31/2013 06/01/2015--He is agreeable to do another set of HemeOccult Cards----------- he did these and these were negative 3-------06/2015 03/22/17 -- discussed Cologuard--he is interested in doing this---given to him today --------ADDENDUM ADDED 05/01/2017---COLOGUARD RESULT CAME BACK POSITIVE---ON 04/27/2017 REFERRAL WAS PUT IN FOR COLONOSCOPY. --------------------------------- Prostate Cancer Screening: No further screening indicated given advanced age    Immunizations:  Influenza vaccine: 06/2016 Tetanus: This was updated at our visit 07/25/13 Pneumonia vaccine: He was given Pneumovax 23 here-- 07/25/13.     PREVNAR 13---given here 08/14/2014 Zostavax: Discussed this at the last visit. He says that he checked and his cost is going to be $95 and he defers. Shingrix: At [place] 03/22/2017 I wrote this on his AVS and told him to check with his insurance regarding their coverage and what his cost would be. He will then let us know this information.    Routine Office visit 3 months or sooner if needed.    [address] [place], [place], [facility] 03/22/2017 8:33 AM

## 2017-04-06 DIAGNOSIS — R69 Illness, unspecified: Secondary | ICD-10-CM | POA: Diagnosis not present

## 2017-04-10 DIAGNOSIS — Z1212 Encounter for screening for malignant neoplasm of rectum: Secondary | ICD-10-CM | POA: Diagnosis not present

## 2017-04-10 DIAGNOSIS — Z1211 Encounter for screening for malignant neoplasm of colon: Secondary | ICD-10-CM | POA: Diagnosis not present

## 2017-04-15 LAB — COLOGUARD: COLOGUARD: POSITIVE

## 2017-04-17 ENCOUNTER — Telehealth: Payer: Self-pay

## 2017-04-17 DIAGNOSIS — Z1211 Encounter for screening for malignant neoplasm of colon: Secondary | ICD-10-CM

## 2017-04-17 NOTE — Telephone Encounter (Signed)
Received a  call from Maudie Mercury from [employer] regarding patient cologuard results. The results are positive. Previously patient did not want a colonoscopy done because insurance would only cover 80% and cost was an issue Pls advise

## 2017-04-18 NOTE — Telephone Encounter (Signed)
Spoke with patient he is aware of his results but at this time is not willing to do anymore testing due to his out of pocket cost.

## 2017-04-20 NOTE — Telephone Encounter (Signed)
Patient left a message on voicemail stating he would like to go ahead and have a colonoscopy. He needs this after May 10, 2017. CB# [phone]

## 2017-04-21 NOTE — Telephone Encounter (Signed)
Please schedule colonoscopy

## 2017-04-22 DIAGNOSIS — E119 Type 2 diabetes mellitus without complications: Secondary | ICD-10-CM | POA: Diagnosis not present

## 2017-04-22 DIAGNOSIS — Z7984 Long term (current) use of oral hypoglycemic drugs: Secondary | ICD-10-CM | POA: Diagnosis not present

## 2017-04-22 DIAGNOSIS — Z6824 Body mass index (BMI) 24.0-24.9, adult: Secondary | ICD-10-CM | POA: Diagnosis not present

## 2017-04-22 DIAGNOSIS — K649 Unspecified hemorrhoids: Secondary | ICD-10-CM | POA: Diagnosis not present

## 2017-04-22 DIAGNOSIS — I10 Essential (primary) hypertension: Secondary | ICD-10-CM | POA: Diagnosis not present

## 2017-04-22 DIAGNOSIS — Z0000 Encounter for general adult medical examination without abnormal findings: Secondary | ICD-10-CM | POA: Diagnosis not present

## 2017-04-22 DIAGNOSIS — H6123 Impacted cerumen, bilateral: Secondary | ICD-10-CM | POA: Diagnosis not present

## 2017-04-22 DIAGNOSIS — K08409 Partial loss of teeth, unspecified cause, unspecified class: Secondary | ICD-10-CM | POA: Diagnosis not present

## 2017-04-22 DIAGNOSIS — H409 Unspecified glaucoma: Secondary | ICD-10-CM | POA: Diagnosis not present

## 2017-04-27 NOTE — Telephone Encounter (Signed)
Referral has been put in for colonoscopy. Pt is aware

## 2017-05-04 ENCOUNTER — Encounter: Payer: Self-pay | Admitting: Internal Medicine

## 2017-05-09 ENCOUNTER — Other Ambulatory Visit: Payer: Self-pay | Admitting: Physician Assistant

## 2017-05-10 NOTE — Telephone Encounter (Signed)
Refill appropriate 

## 2017-06-02 DIAGNOSIS — Z23 Encounter for immunization: Secondary | ICD-10-CM | POA: Diagnosis not present

## 2017-06-06 ENCOUNTER — Other Ambulatory Visit: Payer: Self-pay | Admitting: Family Medicine

## 2017-06-06 MED ORDER — ATORVASTATIN CALCIUM 10 MG PO TABS: ORAL_TABLET | ORAL | 0 refills | Status: DC

## 2017-06-06 NOTE — Telephone Encounter (Signed)
Pharm requesting a 9 day supply - think that is a typo sent for 90 day supply but ok for 9 day if that was correct.

## 2017-06-12 ENCOUNTER — Other Ambulatory Visit: Payer: Self-pay | Admitting: Physician Assistant

## 2017-06-22 ENCOUNTER — Encounter: Payer: Self-pay | Admitting: Physician Assistant

## 2017-06-22 ENCOUNTER — Ambulatory Visit (INDEPENDENT_AMBULATORY_CARE_PROVIDER_SITE_OTHER): Payer: Medicare HMO | Admitting: Physician Assistant

## 2017-06-22 VITALS — BP 124/68 | HR 72 | Temp 97.7°F | Resp 14 | Ht 71.0 in | Wt 173.6 lb

## 2017-06-22 DIAGNOSIS — I10 Essential (primary) hypertension: Secondary | ICD-10-CM

## 2017-06-22 DIAGNOSIS — E785 Hyperlipidemia, unspecified: Secondary | ICD-10-CM

## 2017-06-22 DIAGNOSIS — E119 Type 2 diabetes mellitus without complications: Secondary | ICD-10-CM | POA: Diagnosis not present

## 2017-06-22 NOTE — Progress Notes (Signed)
Patient ID: Aaron Mcfarland MRN: [id number], DOB: [birth year], 76 y.o. Date of Encounter: _0 @  Chief Complaint:  Chief Complaint  Patient presents with  . 3 month routine f/u    HPI: 76 y.o. year old AA male  presents for above.  I saw him as a new patient to our office on 04/24/13. At that time he reported that his prior PCP, who was in [place], had retired 3 months prior. He reported that his last visit there was April 2014. He reported that his wife, Aaron Mcfarland, recently became a patient at this office and had been pleased. She recommended that he come to our office also since his PCP retired. He worked at [employer]. Played softball until age 29!!! 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  Depending on the weather--he either --- walks 40 minutes twice a day. He does this walking with his with his wife----OR------he and his wife walk once a day around 2 PM. Then he also does elliptical and bicycle for 15 minutes per day and uses a rowing machine.  Does exercise 5 days per week.  He checks his blood pressure every day and gets good readings. He was checking his blood sugar every morning and was getting between 90-120. I told him he did not have to cont to check every day--he has decreased to checking twice a week--gets around 110-130.   At prior lab we have told him to cut his Lipitor dose in half ---and only take Mon/Wed/Fri--- he says that he is taking this with these directions.    As well at last lab we have told him to cut his metformin down in half from 1000 twice a day to 500 twice a day. He also reports that he did make this change.  Also at last office visit I added aspirin 81 mg daily. He states that he did add this medication and is taking it as directed.  Taking  all medications as directed with no adverse effects.  At [place] 11/2014-- he reported that at night he felt acid come up in his throat.  Never happened  during the day. Only at night.  Was eaating "dinner "  At 6 pm.  Ate a sandwich at 10:00. Goes to bed at midnight.  When in bed, was feeling this acid. Often he was woken several hours after going to bed with acid coming up in his throat.  At [place] 02/2015 he reports he stopped eating the sandwich at 10pm. "I just eat enough at dinner that I'm not hungry later, now"---reflux has stopped.      At [place] 12/02/2015:  He reports the following: Says that after the antibiotics I prescribed last time, his symptoms cleared and stayed resolved for 2 weeks. However, after that, started developing symptoms again. Says that he feels drainage in his throat. It feels like it is coming down from his nose. Says that when he blows his nose it is watery clear. Says that there is no thick dark mucus. He is not feeling any congestion in his chest. Has had no fevers or chills. Says he has been using Equate brand medicine similar to mucinex-DM--says has guafennisin.  Discussed with him that I see Zyrtec on his medication list and asked if he is currently taking this. Replies that he only takes this as needed for seasonal allergies --that he has allergy to [facility] and starts naming other specific  allergies. No other complaints or concerns at this visit today. Diabetic medications as directed with no adverse effects. In his Lipitor as directed above with no adverse effects.  OV 03/14/2016: At [place] 12/2015 A1C was slightly high and had been creeping up. At that time -- increased Metformin from 565m 1 BID to 2 in am and 1 pm.  At [facility] 03/14/2016--he says he did increase / adjust the dose and is taking it correctly/as directed.  Checks BS ~ twice a week. Getting good readings. 93 this morning.  Taking Lipitor 144m No myalgias or other adverse effects.   OV 12/14/2016: He is taking diabetic medications as directed. He still checks blood sugar about twice a week. Continuing to get good readings. He continues to stay very physically active  and is very compliant with diet. Verified that he is taking metformin 2 in the morning and 1 in the evening. This is causing no adverse effects. He is taking the Lipitor. No myalgias or other adverse effects. He is taking blood pressure medications as directed. No lightheadedness or other adverse effects. Today he says that he "has been feeling great!"  03/22/2017: Again today, he says that he has been feeling great!!! He continues to stay very physically active and very compliant with his diet. He is taking diabetic medications as directed. Taking metformin 2 in the morning and 1 in the evening. This is causing no adverse effects. He continues to check blood sugar 2 times per week and is continuing to get good readings. He is taking the Lipitor. No myalgias or other adverse effects. He is taking his blood pressure medications as directed. No lightheadedness or other adverse effects. AT OV 03/22/2017--CHECKING A1C, COLOGUARD, SHINGRIX---SEE A/P FOR DETAILS    06/22/2017: Today reviewed that at his last visit A1c came back 5.9. At that time told him to decrease metformin from taking 2 in the morning 1 in the evening to taking 1 twice daily. Today he verifies that he did make this change and is now taking 1 twice a day. Again today he says that he continues to feel good. He has no complaints or concerns to address today. He is taking the Lipitor. No myalgias or other adverse effects. He is taking his blood pressure medications as directed. No lightheadedness or other adverse effects. After last visit he did the Cologuard and this came back positive. Today he reports that he is scheduled for colonoscopy October 20 with [facility]. After last visit he did check on cost of shingrix and the cost was $142. Says that even the other one Zostavax was going to be $80 so he is not going to either of these now secondary to cost. Last visit he told me he had chronic scattered Sam scheduled for August. Says he  ended up having a funeral that day and is going to reschedule that eye exam. He has already had flu vaccine.   Past Medical History:  Diagnosis Date  . Diabetes mellitus without complication ([place]  . Hyperlipidemia   . Hypertension      Home Meds:  Outpatient Medications Prior to Visit  Medication Sig Dispense Refill  . aspirin 81 MG tablet Take 1 tablet (81 mg total) by mouth daily. 30 tablet 11  . atorvastatin (LIPITOR) 10 MG tablet TAKE ONE TABLET BY MOUTH ONCE DAILY WITH BREAKFAST 90 tablet 0  . benazepril (LOTENSIN) 10 MG tablet TAKE ONE TABLET BY MOUTH ONCE DAILY 30 tablet 3  . Blood Glucose Monitoring Suppl (  ONE TOUCH ULTRA SYSTEM KIT) W/DEVICE KIT 1 kit by Does not apply route once. 1 each 0  . Fluticasone Propionate (FLONASE ALLERGY RELIEF NA) Place 2 sprays into the nose daily. Buying OTC    . glucose blood test strip Check blood sugar each day fasting. 50 each 2  . Lancets (ONETOUCH ULTRASOFT) lancets Check blood sugar once daily fasting 100 each 2  . latanoprost (XALATAN) 0.005 % ophthalmic solution Place 1 drop into the left eye at bedtime.    . metFORMIN (GLUCOPHAGE) 500 MG tablet Take 1 tablet by mouth twice a day. 90 tablet 3  . metFORMIN (GLUCOPHAGE) 500 MG tablet TAKE 2 TABLETS BY MOUTH IN THE MORNING AND TAKE  1 TABLET BY MOUTH IN THE EVENING 90 tablet 3  . Multiple Vitamins-Minerals (MULTIVITAMIN PO) Take by mouth.    . triamterene-hydrochlorothiazide (MAXZIDE-25) 37.5-25 MG tablet TAKE 1 TABLET BY MOUTH ONCE DAILY 90 tablet 0   No facility-administered medications prior to visit.      Allergies: No Known Allergies  Social History   Social History  . Marital status: Married    Spouse name: N/A  . Number of children: N/A  . Years of education: N/A   Occupational History  . Not on file.   Social History Main Topics  . Smoking status: Former Smoker    Quit date: 04/24/1966  . Smokeless tobacco: Never Used  . Alcohol use Yes  . Drug use: No  . Sexual  activity: Yes    Birth control/ protection: None   Other Topics Concern  . Not on file   Social History Narrative  . No narrative on file    Family History  Problem Relation Age of Onset  . Cancer Mother        ovarian  . Hypertension Father   . Stroke Father   . Diabetes Brother   . Heart disease Brother   . Stroke Brother      Review of Systems:  See HPI for pertinent ROS. All other ROS negative.    Physical Exam: Blood pressure 124/68, pulse 72, temperature 97.7 F (36.5 C), temperature source Oral, resp. rate 14, height _0  (1.803 m), weight 78.7 kg (173 lb 9.6 oz), SpO2 99 %., There is no height or weight on file to calculate BMI. General: WNWD AAM. Appears in no acute distress. Neck: Supple. No thyromegaly. No lymphadenopathy. No carotid bruits. Lungs: Clear bilaterally to auscultation without wheezes, rales, or rhonchi. Breathing is unlabored. Heart: RRR with S1 S2. No murmurs, rubs, or gallops. Abdomen: Soft, non-tender, non-distended with normoactive bowel sounds. No hepatomegaly. No rebound/guarding. No obvious abdominal masses. Musculoskeletal:  Strength and tone normal for age. Extremities/Skin: Warm and dry. Neuro: Alert and oriented X 3. Moves all extremities spontaneously. Gait is normal. CNII-XII grossly in tact. Psych:  Responds to questions appropriately with a normal affect. Diabetic foot exam: Inspection is normal. No other wounds and no calluses or concerning areas. Sensation intact throughout. He has 2+ dorsalis pedis and posterior tibial pulses bilaterally.      ASSESSMENT AND PLAN:  76 y.o. year old male with     Diabetes mellitus without complication  2/70/3500: Today we'll check A1c and see metastases and microalbumin and lipid panel. He will reschedule his eye exam.  At prior OV he reported he had not had an eye exam in almost 15 years. We had discussed need for diabetic eye exam.   at prior OV.  At [place] --02/10/14--He reported that he  did  see Eye Doctor.  Has Glaucoma Left Eye. Has Cataract Right Eye. Also says he is getting glasses. At [place] 2/16 reports had recent cataract surgery. Says last Eye Exam was 10/2014--Pt reports there was no diabetic retinopathy/ diabetic eye problems.   At [place] 12/02/15 he states that he did have another diabetic eye exam this year--January 2017 At [place] 03/22/17 he reports that he has eye exam scheduled for August 2018. At [place] 06/22/17 reports that he had a funeral the day of that eye exam in August so he is going to reschedule the eye exam. Diabetic Foot Exam: normal  He is on statin. He is now on ACE Inhibitor-- benazepril 54m QD.  He is now on Aspirin 842m     Hyperlipidemia Lipids excellent and we actually had him decrease his Lipitor to half of a pill on Monday Wednesday Friday only and he is taking this dose. Had FLP/LFT 11/2014--LDL 36. HDL 56----Excellent Had FLP/LFT 06/01/15--LDL 60. --Excellent Had FLP/LFT 12/2015---LDL 50--excellent Had FLP/LFT 06/2016----Excellent 12/14/2016: Recheck FLP/LFT 03/22/17 he had lipid panel on 12/1416 which was excellent. HDL 70 and LDL 73. Continue current medication Lipitor. 06/22/17--he is fasting. Recheck FLP, LFT now. He is on Lipitor.  Hypertension He is now on ACE inhibitor. 12/14/2016: He is on ACE Inh. Blood Pressure at goal. Check lab to monitor. 03/22/17: Blood pressure is at goal. On ACE inhibitor. Lab was normal 12/14/16. 06/22/17 blood pressure is at goal. He is on ACE inhibitor. Check lab to monitor.  GERD At [place] 11/2014--He was to stop eating and drinking several hours prior to going to bed.  [place] he reports he did make this change and symptoms have resolved.    Screening for colorectal cancer At prior office visit we discussed the fact that his last colonoscopy was 10 years ago.  [place] I  asked him about this and he said he called his insurance and they were going to cover 80% but not the full cost. He stated that he cannot afford  this. He was agreeable to do Hemoccults ---He did turn in Hemoccult x3 ---- they were negative x3.---10/31/2013 06/01/2015--He is agreeable to do another set of HemeOccult Cards----------- he did these and these were negative 3-------06/2015 03/22/17 -- discussed Cologuard--he is interested in doing this---given to him today --------ADDENDUM ADDED 05/01/2017---COLOGUARD RESULT CAME BACK POSITIVE---ON 04/27/2017 REFERRAL WAS PUT IN FOR COLONOSCOPY. --------------------------------- 06/22/17 reviewed that cologuard came back positive. He states that he is scheduled for colonoscopy October 20 with [facility]  Prostate Cancer Screening: No further screening indicated given advanced age   Immunizations:  Influenza vaccine: 06/2016, 06/2017 Tetanus: This was updated at our visit 07/25/13 Pneumonia vaccine: He was given Pneumovax 23 here-- 07/25/13.     PREVNAR 13---given here 08/14/2014 Zostavax: Discussed this at the last visit. He says that he checked and his cost is going to be $95 and he defers. [place]ingrix: [place] I wrote this on his AVS and told him to check with his insurance regarding their coverage and what his cost would be. He will then let usKoreanow this information. 06/22/2017--- he checked with insurance for cost of both the Zostavax and the shingrix and both are too expensive for him so he defers getting either of these now.   Routine Office visit 3 months or sooner if needed.    Signed, [email or web address]/20/2018 8:07 AM

## 2017-06-23 ENCOUNTER — Encounter: Payer: Self-pay | Admitting: Physician Assistant

## 2017-06-23 LAB — COMPLETE METABOLIC PANEL WITH GFR
AG RATIO: 1.7 (calc) (ref 1.0–2.5)
ALKALINE PHOSPHATASE (APISO): 78 U/L (ref 40–115)
ALT: 21 U/L (ref 9–46)
AST: 18 U/L (ref 10–35)
Albumin: 4.7 g/dL (ref 3.6–5.1)
BILIRUBIN TOTAL: 0.6 mg/dL (ref 0.2–1.2)
BUN: 19 mg/dL (ref 7–25)
CHLORIDE: 102 mmol/L (ref 98–110)
CO2: 25 mmol/L (ref 20–32)
Calcium: 10.3 mg/dL (ref 8.6–10.3)
Creat: 1.18 mg/dL (ref 0.70–1.18)
GFR, Est African American: 69 mL/min/{1.73_m2} (ref >=60)
GFR, Est Non African American: 59 mL/min/{1.73_m2} — ABNORMAL LOW (ref >=60)
GLOBULIN: 2.8 g/dL (ref 1.9–3.7)
Glucose, Bld: 130 mg/dL — ABNORMAL HIGH (ref 65–99)
POTASSIUM: 4.2 mmol/L (ref 3.5–5.3)
SODIUM: 137 mmol/L (ref 135–146)
Total Protein: 7.5 g/dL (ref 6.1–8.1)

## 2017-06-23 LAB — MICROALBUMIN, URINE: MICROALB UR: 0.2 mg/dL

## 2017-06-23 LAB — HEMOGLOBIN A1C
Hgb A1c MFr Bld: 5.7 % of total Hgb — ABNORMAL HIGH (ref <5.7)
Mean Plasma Glucose: 117 (calc)
eAG (mmol/L): 6.5 (calc)

## 2017-06-23 LAB — LIPID PANEL
CHOLESTEROL: 136 mg/dL (ref <200)
HDL: 76 mg/dL (ref >40)
LDL Cholesterol (Calc): 45 mg/dL (calc)
NON-HDL CHOLESTEROL (CALC): 60 mg/dL (ref <130)
Total CHOL/HDL Ratio: 1.8 (calc) (ref <5.0)
Triglycerides: 74 mg/dL (ref <150)

## 2017-07-04 ENCOUNTER — Ambulatory Visit (AMBULATORY_SURGERY_CENTER): Payer: Self-pay | Admitting: *Deleted

## 2017-07-04 ENCOUNTER — Encounter: Payer: Self-pay | Admitting: Internal Medicine

## 2017-07-04 VITALS — Ht 71.0 in | Wt 173.6 lb

## 2017-07-04 DIAGNOSIS — R195 Other fecal abnormalities: Secondary | ICD-10-CM

## 2017-07-04 MED ORDER — NA SULFATE-K SULFATE-MG SULF 17.5-3.13-1.6 GM/177ML PO SOLN: 1.0000 | Freq: Once | ORAL | 0 refills | Status: AC

## 2017-07-04 NOTE — Progress Notes (Signed)
No egg or soy allergy known to patient  No issues with past sedation with any surgeries  or procedures, no intubation problems  No diet pills per patient No home 02 use per patient  No blood thinners per patient  Pt denies issues with constipation  No A fib or A flutter  EMMI video sent to pt's e mail  - pt has no e mail   

## 2017-07-18 ENCOUNTER — Ambulatory Visit (AMBULATORY_SURGERY_CENTER): Payer: Medicare HMO | Admitting: Internal Medicine

## 2017-07-18 ENCOUNTER — Encounter: Payer: Self-pay | Admitting: Internal Medicine

## 2017-07-18 VITALS — BP 115/66 | HR 78 | Temp 97.1°F | Resp 14 | Ht 71.0 in | Wt 173.0 lb

## 2017-07-18 DIAGNOSIS — E119 Type 2 diabetes mellitus without complications: Secondary | ICD-10-CM | POA: Diagnosis not present

## 2017-07-18 DIAGNOSIS — R195 Other fecal abnormalities: Secondary | ICD-10-CM

## 2017-07-18 DIAGNOSIS — K635 Polyp of colon: Secondary | ICD-10-CM

## 2017-07-18 DIAGNOSIS — D124 Benign neoplasm of descending colon: Secondary | ICD-10-CM | POA: Diagnosis not present

## 2017-07-18 DIAGNOSIS — D125 Benign neoplasm of sigmoid colon: Secondary | ICD-10-CM

## 2017-07-18 DIAGNOSIS — D123 Benign neoplasm of transverse colon: Secondary | ICD-10-CM

## 2017-07-18 DIAGNOSIS — I10 Essential (primary) hypertension: Secondary | ICD-10-CM | POA: Diagnosis not present

## 2017-07-18 MED ORDER — SODIUM CHLORIDE 0.9 % IV SOLN: 500.0000 mL | INTRAVENOUS | Status: DC

## 2017-07-18 NOTE — Op Note (Signed)
[facility] Patient Name: Aaron Mcfarland Procedure Date: 07/18/2017 8:42 AM MRN: [id number] Endoscopist: Jerene Bears , MD Age: 76 Referring MD:  Date of Birth: [birth year] Gender: Male Account #: [id number] Procedure:                Colonoscopy Indications:              Positive Cologuard test Medicines:                Monitored Anesthesia Care Procedure:                Pre-Anesthesia Assessment:                           - Prior to the procedure, a History and Physical                            was performed, and patient medications and                            allergies were reviewed. The patient's tolerance of                            previous anesthesia was also reviewed. The risks                            and benefits of the procedure and the sedation                            options and risks were discussed with the patient.                            All questions were answered, and informed consent                            was obtained. Prior Anticoagulants: The patient has                            taken no previous anticoagulant or antiplatelet                            agents. ASA Grade Assessment: II - A patient with                            mild systemic disease. After reviewing the risks                            and benefits, the patient was deemed in                            satisfactory condition to undergo the procedure.                           After obtaining informed consent, the colonoscope  was passed under direct vision. Throughout the                            procedure, the patient's blood pressure, pulse, and                            oxygen saturations were monitored continuously. The                            Colonoscope was introduced through the anus and                            advanced to the the cecum, identified by                            appendiceal orifice and ileocecal valve. The                             colonoscopy was performed without difficulty. The                            patient tolerated the procedure well. The quality                            of the bowel preparation was good. The ileocecal                            valve, appendiceal orifice, and rectum were                            photographed. Scope In: 8:49:08 AM Scope Out: 9:10:54 AM Scope Withdrawal Time: 0 hours 16 minutes 49 seconds  Total Procedure Duration: 0 hours 21 minutes 46 seconds  Findings:                 The perianal exam findings include skin tags.                           Two sessile polyps were found in the proximal                            transverse colon. The polyps were 4 to 9 mm in                            size. These polyps were removed with a cold snare.                            Resection and retrieval were complete.                           A 3 mm polyp was found in the descending colon. The                            polyp was sessile. The polyp was  removed with a                            cold snare. Resection and retrieval were complete.                           A 5 mm polyp was found in the sigmoid colon. The                            polyp was sessile. The polyp was removed with a                            cold snare. Resection and retrieval were complete.                           Multiple small and large-mouthed diverticula were                            found in the sigmoid colon and descending colon.                           Internal hemorrhoids were found during                            retroflexion. The hemorrhoids were large. Complications:            No immediate complications. Estimated Blood Loss:     Estimated blood loss was minimal. Impression:               - Two 4 to 9 mm polyps in the proximal transverse                            colon, removed with a cold snare. Resected and                            retrieved.                            - One 3 mm polyp in the descending colon, removed                            with a cold snare. Resected and retrieved.                           - One 5 mm polyp in the sigmoid colon, removed with                            a cold snare. Resected and retrieved.                           - Moderate diverticulosis in the sigmoid colon and                            in the descending colon.                           -  Internal hemorrhoids. Recommendation:           - Patient has a contact number available for                            emergencies. The signs and symptoms of potential                            delayed complications were discussed with the                            patient. Return to normal activities tomorrow.                            Written discharge instructions were provided to the                            patient.                           - Resume previous diet.                           - Continue present medications.                           - Await pathology results.                           - Repeat colonoscopy is recommended. The                            colonoscopy date will be determined after pathology                            results from today's exam become available for                            review. Jerene Bears, MD 07/18/2017 9:17:16 AM This report has been signed electronically.

## 2017-07-18 NOTE — Patient Instructions (Signed)
   Information on polyps,diverticulosis,and hemorrhoids given to you today  Await pathology results on polyps removed    YOU HAD AN ENDOSCOPIC PROCEDURE TODAY AT THE [facility]:   Refer to the procedure report that was given to you for any specific questions about what was found during the examination.  If the procedure report does not answer your questions, please call your gastroenterologist to clarify.  If you requested that your care partner not be given the details of your procedure findings, then the procedure report has been included in a sealed envelope for you to review at your convenience later.  YOU SHOULD EXPECT: Some feelings of bloating in the abdomen. Passage of more gas than usual.  Walking can help get rid of the air that was put into your GI tract during the procedure and reduce the bloating. If you had a lower endoscopy (such as a colonoscopy or flexible sigmoidoscopy) you may notice spotting of blood in your stool or on the toilet paper. If you underwent a bowel prep for your procedure, you may not have a normal bowel movement for a few days.  Please Note:  You might notice some irritation and congestion in your nose or some drainage.  This is from the oxygen used during your procedure.  There is no need for concern and it should clear up in a day or so.  SYMPTOMS TO REPORT IMMEDIATELY:   Following lower endoscopy (colonoscopy or flexible sigmoidoscopy):  Excessive amounts of blood in the stool  Significant tenderness or worsening of abdominal pains  Swelling of the abdomen that is new, acute  Fever of 100F or higher    For urgent or emergent issues, a gastroenterologist can be reached at any hour by calling [phone].   DIET:  We do recommend a small meal at first, but then you may proceed to your regular diet.  Drink plenty of fluids but you should avoid alcoholic beverages for 24 hours.  ACTIVITY:  You should plan to take it easy for the rest of  today and you should NOT DRIVE or use heavy machinery until tomorrow (because of the sedation medicines used during the test).    FOLLOW UP: Our staff will call the number listed on your records the next business day following your procedure to check on you and address any questions or concerns that you may have regarding the information given to you following your procedure. If we do not reach you, we will leave a message.  However, if you are feeling well and you are not experiencing any problems, there is no need to return our call.  We will assume that you have returned to your regular daily activities without incident.  If any biopsies were taken you will be contacted by phone or by letter within the next 1-3 weeks.  Please call us at [phone] if you have not heard about the biopsies in 3 weeks.    SIGNATURES/CONFIDENTIALITY: You and/or your care partner have signed paperwork which will be entered into your electronic medical record.  These signatures attest to the fact that that the information above on your After Visit Summary has been reviewed and is understood.  Full responsibility of the confidentiality of this discharge information lies with you and/or your care-partner. 

## 2017-07-18 NOTE — Progress Notes (Signed)
Pt's states no medical or surgical changes since previsit or office visit. 

## 2017-07-18 NOTE — Progress Notes (Signed)
Report to PACU, RN, vss, BBS= Clear.  

## 2017-07-18 NOTE — Progress Notes (Signed)
Called to room to assist during endoscopic procedure.  Patient ID and intended procedure confirmed with present staff. Received instructions for my participation in the procedure from the performing physician.  

## 2017-07-19 ENCOUNTER — Telehealth: Payer: Self-pay

## 2017-07-19 ENCOUNTER — Telehealth: Payer: Self-pay | Admitting: *Deleted

## 2017-07-19 NOTE — Telephone Encounter (Signed)
  Follow up Call-  Call back number 07/18/2017  Post procedure Call Back phone  # [phone]  Permission to leave phone message Yes  Some recent data might be hidden     Patient questions:  Do you have a fever, pain , or abdominal swelling? No. Pain Score  0 *  Have you tolerated food without any problems? Yes.    Have you been able to return to your normal activities? Yes.    Do you have any questions about your discharge instructions: Diet   No. Medications  No. Follow up visit  No.  Do you have questions or concerns about your Care? No.  Actions: * If pain score is 4 or above: No action needed, pain <4.

## 2017-07-19 NOTE — Telephone Encounter (Signed)
Attempted to reach patient for post-procedure f/u call. No answer. Left message that we will attempt to reach him again later today and for him to please not hesitate to call us if he has any questions/concerns regarding his care. 

## 2017-07-24 ENCOUNTER — Encounter: Payer: Self-pay | Admitting: Internal Medicine

## 2017-09-14 ENCOUNTER — Other Ambulatory Visit: Payer: Self-pay | Admitting: Physician Assistant

## 2017-09-14 DIAGNOSIS — H401131 Primary open-angle glaucoma, bilateral, mild stage: Secondary | ICD-10-CM | POA: Diagnosis not present

## 2017-09-14 DIAGNOSIS — E119 Type 2 diabetes mellitus without complications: Secondary | ICD-10-CM | POA: Diagnosis not present

## 2017-09-14 DIAGNOSIS — H2512 Age-related nuclear cataract, left eye: Secondary | ICD-10-CM | POA: Diagnosis not present

## 2017-09-14 DIAGNOSIS — H47233 Glaucomatous optic atrophy, bilateral: Secondary | ICD-10-CM | POA: Diagnosis not present

## 2017-09-21 ENCOUNTER — Telehealth: Payer: Self-pay

## 2017-09-21 ENCOUNTER — Ambulatory Visit: Payer: Self-pay | Admitting: Physician Assistant

## 2017-09-21 ENCOUNTER — Encounter: Payer: Self-pay | Admitting: Physician Assistant

## 2017-09-21 ENCOUNTER — Ambulatory Visit (INDEPENDENT_AMBULATORY_CARE_PROVIDER_SITE_OTHER): Payer: Medicare HMO | Admitting: Physician Assistant

## 2017-09-21 VITALS — BP 140/84 | HR 75 | Temp 98.3°F | Resp 16 | Ht 71.0 in | Wt 177.4 lb

## 2017-09-21 DIAGNOSIS — E119 Type 2 diabetes mellitus without complications: Secondary | ICD-10-CM | POA: Diagnosis not present

## 2017-09-21 DIAGNOSIS — E785 Hyperlipidemia, unspecified: Secondary | ICD-10-CM

## 2017-09-21 DIAGNOSIS — I10 Essential (primary) hypertension: Secondary | ICD-10-CM

## 2017-09-21 DIAGNOSIS — J988 Other specified respiratory disorders: Secondary | ICD-10-CM | POA: Diagnosis not present

## 2017-09-21 DIAGNOSIS — B9689 Other specified bacterial agents as the cause of diseases classified elsewhere: Secondary | ICD-10-CM

## 2017-09-21 MED ORDER — AZITHROMYCIN 250 MG PO TABS: ORAL_TABLET | ORAL | 0 refills | Status: AC

## 2017-09-21 NOTE — Progress Notes (Signed)
Patient ID: Aaron Mcfarland MRN: [id number], DOB: 13-Feb-1941, 76 y.o. Date of Encounter: @DATE @  Chief Complaint:  Chief Complaint  Patient presents with  . routine office Follow-up  . chest congestion    HPI: 76 y.o. year old AA male  presents for above.  I saw him as a new patient to our office on 04/24/13. At that time he reported that his prior PCP, who was in [place], had retired 3 months prior. He reported that his last visit there was April 2014. He reported that his wife, Aaron Mcfarland, recently became a patient at this office and had been pleased. She recommended that he come to our office also since his PCP retired. He worked at [employer]. Played softball until age 65!!! 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  Depending on the weather--he either --- walks 40 minutes twice a day. He does this walking with his with his wife----OR------he and his wife walk once a day around 2 PM. Then he also does elliptical and bicycle for 15 minutes per day and uses a rowing machine.  Does exercise 5 days per week.  He checks his blood pressure every day and gets good readings. He was checking his blood sugar every morning and was getting between 90-120. I told him he did not have to cont to check every day--he has decreased to checking twice a week--gets around 110-130.   At prior lab we have told him to cut his Lipitor dose in half ---and only take Mon/Wed/Fri--- he says that he is taking this with these directions.    As well at last lab we have told him to cut his metformin down in half from 1000 twice a day to 500 twice a day. He also reports that he did make this change.  Also at last office visit I added aspirin 81 mg daily. He states that he did add this medication and is taking it as directed.  Taking  all medications as directed with no adverse effects.  At [place] 11/2014-- he reported that at night he felt acid come up in his  throat.  Never happened during the day. Only at night.  Was eaating "dinner "  At 6 pm.  Ate a sandwich at 10:00. Goes to bed at midnight.  When in bed, was feeling this acid. Often he was woken several hours after going to bed with acid coming up in his throat.  At [place] 02/2015 he reports he stopped eating the sandwich at 10pm. "I just eat enough at dinner that I'm not hungry later, now"---reflux has stopped.      At [place] 12/02/2015:  He reports the following: Says that after the antibiotics I prescribed last time, his symptoms cleared and stayed resolved for 2 weeks. However, after that, started developing symptoms again. Says that he feels drainage in his throat. It feels like it is coming down from his nose. Says that when he blows his nose it is watery clear. Says that there is no thick dark mucus. He is not feeling any congestion in his chest. Has had no fevers or chills. Says he has been using Equate brand medicine similar to mucinex-DM--says has guafennisin.  Discussed with him that I see Zyrtec on his medication list and asked if he is currently taking this. Replies that he only takes this as needed for seasonal allergies --that he has allergy to [name] and starts  naming other specific allergies. No other complaints or concerns at this visit today. Diabetic medications as directed with no adverse effects. In his Lipitor as directed above with no adverse effects.  OV 03/14/2016: At [place] 12/2015 A1C was slightly high and had been creeping up. At that time -- increased Metformin from 554m 1 BID to 2 in am and 1 pm.  At [place] 03/14/2016--he says he did increase / adjust the dose and is taking it correctly/as directed.  Checks BS ~ twice a week. Getting good readings. 93 this morning.  Taking Lipitor 142m No myalgias or other adverse effects.   OV 12/14/2016: He is taking diabetic medications as directed. He still checks blood sugar about twice a week. Continuing to get good readings. He continues to  stay very physically active and is very compliant with diet. Verified that he is taking metformin 2 in the morning and 1 in the evening. This is causing no adverse effects. He is taking the Lipitor. No myalgias or other adverse effects. He is taking blood pressure medications as directed. No lightheadedness or other adverse effects. Today he says that he "has been feeling great!"  03/22/2017: Again today, he says that he has been feeling great!!! He continues to stay very physically active and very compliant with his diet. He is taking diabetic medications as directed. Taking metformin 2 in the morning and 1 in the evening. This is causing no adverse effects. He continues to check blood sugar 2 times per week and is continuing to get good readings. He is taking the Lipitor. No myalgias or other adverse effects. He is taking his blood pressure medications as directed. No lightheadedness or other adverse effects. AT [place] 03/22/2017--CHECKING A1C, COLOGUARD, SHINGRIX---SEE A/P FOR DETAILS   06/22/2017: Today reviewed that at his last visit A1c came back 5.9. At that time told him to decrease metformin from taking 2 in the morning 1 in the evening to taking 1 twice daily. Today he verifies that he did make this change and is now taking 1 twice a day. Again today he says that he continues to feel good. He has no complaints or concerns to address today. He is taking the Lipitor. No myalgias or other adverse effects. He is taking his blood pressure medications as directed. No lightheadedness or other adverse effects. After last visit he did the Cologuard and this came back positive. Today he reports that he is scheduled for colonoscopy October 20 with [facility]. After last visit he did check on cost of shingrix and the cost was $142. Says that even the other one Zostavax was going to be $80 so he is not going to either of these now secondary to cost. Last visit he told me he had chronic scattered Sam  scheduled for August. Says he ended up having a funeral that day and is going to reschedule that eye exam. He has already had flu vaccine.   09/21/2017: Today patient reports that he has been having cough and chest congestion for 2 or 3 weeks.  Says that he really has not had much congestion in his head or nose that it is all seem to be mostly cough.   Has had no fever. Reports that he had his eye exam last Thursday and they told him that that looked good. He has no other concerns to address today. I asked about his Christmas plans.  He states that everyone comes to their house.  He has multiple children and grandchildren and even  has 3 great grandchildren who are ages 25, 7, 2. !!!! They will all be there at his house !! He is taking the Lipitor. No myalgias or other adverse effects. He is taking his blood pressure medications as directed. No lightheadedness or other adverse effects.       Past Medical History:  Diagnosis Date  . Allergy   . Cataract    right eye with lens implant   . Diabetes mellitus without complication ([place])   . GERD (gastroesophageal reflux disease)    some  . Glaucoma    left eye  . Hyperlipidemia   . Hypertension      Home Meds:  Outpatient Medications Prior to Visit  Medication Sig Dispense Refill  . aspirin 81 MG tablet Take 1 tablet (81 mg total) by mouth daily. 30 tablet 11  . atorvastatin (LIPITOR) 10 MG tablet TAKE ONE TABLET BY MOUTH ONCE DAILY WITH BREAKFAST 90 tablet 0  . benazepril (LOTENSIN) 10 MG tablet TAKE ONE TABLET BY MOUTH ONCE DAILY 30 tablet 3  . Blood Glucose Monitoring Suppl (ONE TOUCH ULTRA SYSTEM KIT) W/DEVICE KIT 1 kit by Does not apply route once. 1 each 0  . Fluticasone Propionate (FLONASE ALLERGY RELIEF NA) Place 2 sprays into the nose daily. Buying OTC    . glucose blood test strip Check blood sugar each day fasting. 50 each 2  . Lancets (ONETOUCH ULTRASOFT) lancets Check blood sugar once daily fasting 100 each 2  .  latanoprost (XALATAN) 0.005 % ophthalmic solution Place 1 drop into the left eye at bedtime.    . metFORMIN (GLUCOPHAGE) 500 MG tablet Take 1 tablet by mouth twice a day. 90 tablet 3  . Multiple Vitamins-Minerals (MULTIVITAMIN PO) Take by mouth.    . triamterene-hydrochlorothiazide (MAXZIDE-25) 37.5-25 MG tablet TAKE 1 TABLET BY MOUTH ONCE DAILY 90 tablet 0   Facility-Administered Medications Prior to Visit  Medication Dose Route Frequency Provider Last Rate Last Dose  . 0.9 %  sodium chloride infusion  500 mL Intravenous Continuous Pyrtle, Lajuan Lines, MD         Allergies: No Known Allergies  Social History   Socioeconomic History  . Marital status: Married    Spouse name: Not on file  . Number of children: Not on file  . Years of education: Not on file  . Highest education level: Not on file  Social Needs  . Financial resource strain: Not on file  . Food insecurity - worry: Not on file  . Food insecurity - inability: Not on file  . Transportation needs - medical: Not on file  . Transportation needs - non-medical: Not on file  Occupational History  . Not on file  Tobacco Use  . Smoking status: Former Smoker    Last attempt to quit: 04/24/1966    Years since quitting: 51.4  . Smokeless tobacco: Never Used  Substance and Sexual Activity  . Alcohol use: Yes  . Drug use: No  . Sexual activity: Yes    Birth control/protection: None  Other Topics Concern  . Not on file  Social History Narrative  . Not on file    Family History  Problem Relation Age of Onset  . Cancer Mother        ovarian  . Ovarian cancer Mother   . Hypertension Father   . Stroke Father   . Diabetes Brother   . Heart disease Brother   . Stroke Brother   . Colon cancer Neg Hx   .  Colon polyps Neg Hx   . Esophageal cancer Neg Hx   . Rectal cancer Neg Hx   . Stomach cancer Neg Hx      Review of Systems:  See HPI for pertinent ROS. All other ROS negative.    Physical Exam: Blood pressure 140/84,  pulse 75, temperature 98.3 F (36.8 C), temperature source Oral, resp. rate 16, height 5' 11"  (1.803 m), weight 80.5 kg (177 lb 6.4 oz), SpO2 98 %., There is no height or weight on file to calculate BMI. General: WNWD AAM. Appears in no acute distress. Neck: Supple. No thyromegaly. No lymphadenopathy. No carotid bruits. Lungs: Clear bilaterally to auscultation without wheezes, rales, or rhonchi. Breathing is unlabored. Heart: RRR with S1 S2. No murmurs, rubs, or gallops. Abdomen: Soft, non-tender, non-distended with normoactive bowel sounds. No hepatomegaly. No rebound/guarding. No obvious abdominal masses. Musculoskeletal:  Strength and tone normal for age. Extremities/Skin: Warm and dry. Neuro: Alert and oriented X 3. Moves all extremities spontaneously. Gait is normal. CNII-XII grossly in tact. Psych:  Responds to questions appropriately with a normal affect. Diabetic foot exam: Inspection is normal. No other wounds and no calluses or concerning areas. Sensation intact throughout. He has 2+ dorsalis pedis and posterior tibial pulses bilaterally.      ASSESSMENT AND PLAN:  76 y.o. year old male with   Bacterial respiratory infection He is to take antibiotic as directed.  Follow-up if symptoms do not resolve upon completion of antibiotic. - azithromycin (ZITHROMAX Z-PAK) 250 MG tablet; Take 2 tablets (500 mg) on  Day 1,  followed by 1 tablet (250 mg) once daily on Days 2 through 5.  Dispense: 6 each; Refill: 0   Diabetes mellitus without complication  He is on statin. He is now on ACE Inhibitor-- benazepril 44m QD.  He is now on Aspirin 845m    Check A1c.  Hyperlipidemia Lipids excellent and we actually had him decrease his Lipitor to half of a pill on Monday Wednesday Friday only and he is taking this dose. Had FLP/LFT 11/2014--LDL 36. HDL 56----Excellent Had FLP/LFT 06/01/15--LDL 60. --Excellent Had FLP/LFT 12/2015---LDL 50--excellent Had FLP/LFT 06/2016----Excellent 12/14/2016:  Recheck FLP/LFT 03/22/17 he had lipid panel on 12/1416 which was excellent. HDL 70 and LDL 73. Continue current medication Lipitor. 06/22/17--he is fasting. Recheck FLP, LFT now. He is on Lipitor. 09/21/2017: Lipid Panel was excellent 9/18 with LDL 45.  LFTs were normal.  Can wait to recheck lab.  Hypertension He is now on ACE inhibitor. 12/14/2016: He is on ACE Inh. Blood Pressure at goal. Check lab to monitor. 03/22/17: Blood pressure is at goal. On ACE inhibitor. Lab was normal 12/14/16. 09/21/17 blood pressure is at goal. He is on ACE inhibitor. Check lab to monitor.  GERD At [place] 11/2014--He was to stop eating and drinking several hours prior to going to bed.  [place] he reports he did make this change and symptoms have resolved.    Screening for colorectal cancer At prior office visit we discussed the fact that his last colonoscopy was 10 years ago.  At [place] I  asked him about this and he said he called his insurance and they were going to cover 80% but not the full cost. He stated that he cannot afford this. He was agreeable to do Hemoccults ---He did turn in Hemoccult x3 ---- they were negative x3.---10/31/2013 06/01/2015--He is agreeable to do another set of HemeOccult Cards----------- he did these and these were negative 3-------06/2015 03/22/17 -- discussed Cologuard--he  is interested in doing this---given to him today --------ADDENDUM ADDED 05/01/2017---COLOGUARD RESULT CAME BACK POSITIVE---ON 04/27/2017 REFERRAL WAS PUT IN FOR COLONOSCOPY. --------------------------------- 06/22/17 reviewed that cologuard came back positive. He states that he is scheduled for colonoscopy October 20 with [facility] 09/21/2017: He had colonoscopy on 07/18/17.  In epic.  Prostate Cancer Screening: No further screening indicated given advanced age   Immunizations:  Influenza vaccine: 06/2016, 06/2017 Tetanus: This was updated at our visit 07/25/13 Pneumonia vaccine: He was given Pneumovax 23 here--  07/25/13.     PREVNAR 13---given here 08/14/2014 Zostavax: Discussed this at the last visit. He says that he checked and his cost is going to be $95 and he defers. Shingrix: At [place] 03/22/2017 I wrote this on his AVS and told him to check with his insurance regarding their coverage and what his cost would be. He will then let us know this information. 06/22/2017--- he checked with insurance for cost of both the Zostavax and the shingrix and both are too expensive for him so he defers getting either of these now.   Routine Office visit 3 months or sooner if needed.    Signed, [address] [place], [place], [facility] 09/21/2017 2:30 PM

## 2017-09-21 NOTE — Telephone Encounter (Signed)
Our office received a fax from [facility] that patient was not taking atorvastatin 10 mg as prescribed. Patient was in office for a visit today and I asked him about the rx. Patient is taking the atorvastatin but he is only taking .5mg  Mon,Wed and fri

## 2017-09-22 LAB — HEMOGLOBIN A1C
EAG (MMOL/L): 7.7 (calc)
HEMOGLOBIN A1C: 6.5 %{Hb} — AB (ref <5.7)
Mean Plasma Glucose: 140 (calc)

## 2017-10-04 ENCOUNTER — Other Ambulatory Visit: Payer: Self-pay

## 2017-10-04 MED ORDER — METFORMIN HCL 500 MG PO TABS: ORAL_TABLET | ORAL | 0 refills | Status: DC

## 2017-10-04 NOTE — Telephone Encounter (Signed)
Refill  Appropriate

## 2017-10-27 ENCOUNTER — Other Ambulatory Visit: Payer: Self-pay | Admitting: Physician Assistant

## 2017-10-30 NOTE — Telephone Encounter (Signed)
Refill appropriate 

## 2017-12-21 ENCOUNTER — Encounter: Payer: Self-pay | Admitting: Physician Assistant

## 2017-12-21 ENCOUNTER — Ambulatory Visit (INDEPENDENT_AMBULATORY_CARE_PROVIDER_SITE_OTHER): Payer: Medicare HMO | Admitting: Physician Assistant

## 2017-12-21 ENCOUNTER — Other Ambulatory Visit: Payer: Self-pay

## 2017-12-21 VITALS — BP 120/72 | HR 96 | Temp 98.0°F | Resp 16 | Ht 71.0 in | Wt 181.4 lb

## 2017-12-21 DIAGNOSIS — E785 Hyperlipidemia, unspecified: Secondary | ICD-10-CM | POA: Diagnosis not present

## 2017-12-21 DIAGNOSIS — B9689 Other specified bacterial agents as the cause of diseases classified elsewhere: Secondary | ICD-10-CM

## 2017-12-21 DIAGNOSIS — K219 Gastro-esophageal reflux disease without esophagitis: Secondary | ICD-10-CM

## 2017-12-21 DIAGNOSIS — I10 Essential (primary) hypertension: Secondary | ICD-10-CM

## 2017-12-21 DIAGNOSIS — E119 Type 2 diabetes mellitus without complications: Secondary | ICD-10-CM

## 2017-12-21 DIAGNOSIS — J988 Other specified respiratory disorders: Secondary | ICD-10-CM

## 2017-12-21 MED ORDER — AZITHROMYCIN 250 MG PO TABS: ORAL_TABLET | ORAL | 0 refills | Status: AC

## 2017-12-21 NOTE — Progress Notes (Signed)
Patient ID: Aaron Mcfarland MRN: [id number], DOB: 04-09-1941, 77 y.o. Date of Encounter: _0 @  Chief Complaint:  Chief Complaint  Patient presents with  . routine follow up    HPI: 77 y.o. year old AA male  presents for above.  I saw him as a new patient to our office on 04/24/13. At that time he reported that his prior PCP, who was in [place], had retired 3 months prior. He reported that his last visit there was April 2014. He reported that his wife, Aaron Mcfarland, recently became a patient at this office and had been pleased. She recommended that he come to our office also since his PCP retired. He worked at [employer]. Played softball until age 20!!! 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  Depending on the weather--he either --- walks 40 minutes twice a day. He does this walking with his with his wife----OR------he and his wife walk once a day around 2 PM. Then he also does elliptical and bicycle for 15 minutes per day and uses a rowing machine.  Does exercise 5 days per week.  He checks his blood pressure every day and gets good readings. He was checking his blood sugar every morning and was getting between 90-120. I told him he did not have to cont to check every day--he has decreased to checking twice a week--gets around 110-130.   At prior lab we have told him to cut his Lipitor dose in half ---and only take Mon/Wed/Fri--- he says that he is taking this with these directions.    As well at last lab we have told him to cut his metformin down in half from 1000 twice a day to 500 twice a day. He also reports that he did make this change.  Also at last office visit I added aspirin 81 mg daily. He states that he did add this medication and is taking it as directed.  Taking  all medications as directed with no adverse effects.  At [place] 11/2014-- he reported that at night he felt acid come up in his throat.  Never happened  during the day. Only at night.  Was eaating "dinner "  At 6 pm.  Ate a sandwich at 10:00. Goes to bed at midnight.  When in bed, was feeling this acid. Often he was woken several hours after going to bed with acid coming up in his throat.  At [place] 02/2015 he reports he stopped eating the sandwich at 10pm. "I just eat enough at dinner that I'm not hungry later, now"---reflux has stopped.      At [place] 12/02/2015:  He reports the following: Says that after the antibiotics I prescribed last time, his symptoms cleared and stayed resolved for 2 weeks. However, after that, started developing symptoms again. Says that he feels drainage in his throat. It feels like it is coming down from his nose. Says that when he blows his nose it is watery clear. Says that there is no thick dark mucus. He is not feeling any congestion in his chest. Has had no fevers or chills. Says he has been using Equate brand medicine similar to mucinex-DM--says has guafennisin.  Discussed with him that I see Zyrtec on his medication list and asked if he is currently taking this. Replies that he only takes this as needed for seasonal allergies --that he has allergy to [facility] and starts naming other specific allergies.  No other complaints or concerns at this visit today. Diabetic medications as directed with no adverse effects. In his Lipitor as directed above with no adverse effects.  OV 03/14/2016: At [place] 12/2015 A1C was slightly high and had been creeping up. At that time -- increased Metformin from 598m 1 BID to 2 in am and 1 pm.  At [place] 03/14/2016--he says he did increase / adjust the dose and is taking it correctly/as directed.  Checks BS ~ twice a week. Getting good readings. 93 this morning.  Taking Lipitor 14m No myalgias or other adverse effects.   [place] 12/14/2016: He is taking diabetic medications as directed. He still checks blood sugar about twice a week. Continuing to get good readings. He continues to stay very physically active  and is very compliant with diet. Verified that he is taking metformin 2 in the morning and 1 in the evening. This is causing no adverse effects. He is taking the Lipitor. No myalgias or other adverse effects. He is taking blood pressure medications as directed. No lightheadedness or other adverse effects. Today he says that he "has been feeling great!"  03/22/2017: Again today, he says that he has been feeling great!!! He continues to stay very physically active and very compliant with his diet. He is taking diabetic medications as directed. Taking metformin 2 in the morning and 1 in the evening. This is causing no adverse effects. He continues to check blood sugar 2 times per week and is continuing to get good readings. He is taking the Lipitor. No myalgias or other adverse effects. He is taking his blood pressure medications as directed. No lightheadedness or other adverse effects. AT [place] 03/22/2017--CHECKING A1C, COLOGUARD, SHINGRIX---SEE A/P FOR DETAILS   06/22/2017: Today reviewed that at his last visit A1c came back 5.9. At that time told him to decrease metformin from taking 2 in the morning 1 in the evening to taking 1 twice daily. Today he verifies that he did make this change and is now taking 1 twice a day. Again today he says that he continues to feel good. He has no complaints or concerns to address today. He is taking the Lipitor. No myalgias or other adverse effects. He is taking his blood pressure medications as directed. No lightheadedness or other adverse effects. After last visit he did the Cologuard and this came back positive. Today he reports that he is scheduled for colonoscopy October 20 with [facility]. After last visit he did check on cost of shingrix and the cost was $142. Says that even the other one Zostavax was going to be $80 so he is not going to either of these now secondary to cost. Last visit he told me he had chronic scattered Sam scheduled for August. Says he  ended up having a funeral that day and is going to reschedule that eye exam. He has already had flu vaccine.   09/21/2017: Today patient reports that he has been having cough and chest congestion for 2 or 3 weeks.  Says that he really has not had much congestion in his head or nose that it is all seem to be mostly cough.   Has had no fever. Reports that he had his eye exam last Thursday and they told him that that looked good. He has no other concerns to address today. I asked about his Christmas plans.  He states that everyone comes to their house.  He has multiple children and grandchildren and even has 3 great grandchildren  who are ages 51, 44, 92. !!!! They will all be there at his house !! He is taking the Lipitor. No myalgias or other adverse effects. He is taking his blood pressure medications as directed. No lightheadedness or other adverse effects.   12/21/2017: Today he reports that both he and his wife have been having this congestion for 2 weeks.  Dates that he is still coughing up green phlegm.  Also getting thick dark mucus out of his nose.  Had no significant sore throat.  No fevers or chills. Reports that otherwise up until having this congestion he had been doing well and has no other concerns to address today.  Everything else has seemed to be stable. He is taking the Lipitor. No myalgias or other adverse effects. He is taking his blood pressure medications as directed. No lightheadedness or other adverse effects.      Past Medical History:  Diagnosis Date  . Allergy   . Cataract    right eye with lens implant   . Diabetes mellitus without complication ([place])   . GERD (gastroesophageal reflux disease)    some  . Glaucoma    left eye  . Hyperlipidemia   . Hypertension      Home Meds:  Outpatient Medications Prior to Visit  Medication Sig Dispense Refill  . aspirin 81 MG tablet Take 1 tablet (81 mg total) by mouth daily. 30 tablet 11  . atorvastatin (LIPITOR) 10  MG tablet TAKE ONE TABLET BY MOUTH ONCE DAILY WITH BREAKFAST 90 tablet 0  . benazepril (LOTENSIN) 10 MG tablet TAKE 1 TABLET BY MOUTH ONCE DAILY 30 tablet 3  . Blood Glucose Monitoring Suppl (ONE TOUCH ULTRA SYSTEM KIT) W/DEVICE KIT 1 kit by Does not apply route once. 1 each 0  . Fluticasone Propionate (FLONASE ALLERGY RELIEF NA) Place 2 sprays into the nose daily. Buying OTC    . glucose blood test strip Check blood sugar each day fasting. 50 each 2  . Lancets (ONETOUCH ULTRASOFT) lancets Check blood sugar once daily fasting 100 each 2  . latanoprost (XALATAN) 0.005 % ophthalmic solution Place 1 drop into the left eye at bedtime.    . metFORMIN (GLUCOPHAGE) 500 MG tablet Take 1 tablet by mouth twice a day. 90 tablet 0  . Multiple Vitamins-Minerals (MULTIVITAMIN PO) Take by mouth.    . triamterene-hydrochlorothiazide (MAXZIDE-25) 37.5-25 MG tablet TAKE 1 TABLET BY MOUTH ONCE DAILY 90 tablet 0   Facility-Administered Medications Prior to Visit  Medication Dose Route Frequency Provider Last Rate Last Dose  . 0.9 %  sodium chloride infusion  500 mL Intravenous Continuous Pyrtle, Lajuan Lines, MD         Allergies: No Known Allergies  Social History   Socioeconomic History  . Marital status: Married    Spouse name: Not on file  . Number of children: Not on file  . Years of education: Not on file  . Highest education level: Not on file  Occupational History  . Not on file  Social Needs  . Financial resource strain: Not on file  . Food insecurity:    Worry: Not on file    Inability: Not on file  . Transportation needs:    Medical: Not on file    Non-medical: Not on file  Tobacco Use  . Smoking status: Former Smoker    Last attempt to quit: 04/24/1966    Years since quitting: 51.6  . Smokeless tobacco: Never Used  Substance and Sexual Activity  .  Alcohol use: Yes  . Drug use: No  . Sexual activity: Yes    Birth control/protection: None  Lifestyle  . Physical activity:    Days per  week: Not on file    Minutes per session: Not on file  . Stress: Not on file  Relationships  . Social connections:    Talks on phone: Not on file    Gets together: Not on file    Attends religious service: Not on file    Active member of club or organization: Not on file    Attends meetings of clubs or organizations: Not on file    Relationship status: Not on file  . Intimate partner violence:    Fear of current or ex partner: Not on file    Emotionally abused: Not on file    Physically abused: Not on file    Forced sexual activity: Not on file  Other Topics Concern  . Not on file  Social History Narrative  . Not on file    Family History  Problem Relation Age of Onset  . Cancer Mother        ovarian  . Ovarian cancer Mother   . Hypertension Father   . Stroke Father   . Diabetes Brother   . Heart disease Brother   . Stroke Brother   . Colon cancer Neg Hx   . Colon polyps Neg Hx   . Esophageal cancer Neg Hx   . Rectal cancer Neg Hx   . Stomach cancer Neg Hx      Review of Systems:  See HPI for pertinent ROS. All other ROS negative.    Physical Exam: Blood pressure 120/72, pulse 96, temperature 98 F (36.7 C), temperature source Oral, resp. rate 16, height 5' 11" (1.803 m), weight 82.3 kg (181 lb 6.4 oz), SpO2 97 %., Body mass index is 25.3 kg/m. General: WNWD AAM. Appears in no acute distress. Neck: Supple. No thyromegaly. No lymphadenopathy. No carotid bruits. HEENT: Left ear with cerumen.  He is to use some over-the-counter drops for this.  Right ear exam is normal.  Exam of throat is normal. Lungs: Clear bilaterally to auscultation without wheezes, rales, or rhonchi. Breathing is unlabored. Heart: RRR with S1 S2. No murmurs, rubs, or gallops. Abdomen: Soft, non-tender, non-distended with normoactive bowel sounds. No hepatomegaly. No rebound/guarding. No obvious abdominal masses. Musculoskeletal:  Strength and tone normal for age. Extremities/Skin: Warm and  dry. Neuro: Alert and oriented X 3. Moves all extremities spontaneously. Gait is normal. CNII-XII grossly in tact. Psych:  Responds to questions appropriately with a normal affect. Diabetic foot exam: Inspection is normal. No other wounds and no calluses or concerning areas. Sensation intact throughout. He has 2+ dorsalis pedis and posterior tibial pulses bilaterally.      ASSESSMENT AND PLAN:  77 y.o. year old male with   Bacterial respiratory infection He is to take antibiotic as directed.  Follow-up if symptoms do not resolve upon completion of antibiotic. - azithromycin (ZITHROMAX Z-PAK) 250 MG tablet; Take 2 tablets (500 mg) on  Day 1,  followed by 1 tablet (250 mg) once daily on Days 2 through 5.  Dispense: 6 each; Refill: 0   Diabetes mellitus without complication  He is on statin. He is now on ACE Inhibitor-- benazepril 39m QD.  He is now on Aspirin 825m    12/21/2017: Check A1c.  Hyperlipidemia Lipids excellent and we actually had him decrease his Lipitor to half of a pill on Monday  Wednesday Friday only and he is taking this dose. Had FLP/LFT 11/2014--LDL 36. HDL 56----Excellent Had FLP/LFT 06/01/15--LDL 60. --Excellent Had FLP/LFT 12/2015---LDL 50--excellent Had FLP/LFT 06/2016----Excellent 12/14/2016: Recheck FLP/LFT 03/22/17 he had lipid panel on 12/1416 which was excellent. HDL 70 and LDL 73. Continue current medication Lipitor. 06/22/17--he is fasting. Recheck FLP, LFT now. He is on Lipitor. 09/21/2017: Lipid Panel was excellent 9/18 with LDL 45.  LFTs were normal.  Can wait to recheck lab. 12/21/2017: Is fasting.  Recheck FLP/LFT to monitor.  Hypertension He is now on ACE inhibitor. 12/14/2016: He is on ACE Inh. Blood Pressure at goal. Check lab to monitor. 03/22/17: Blood pressure is at goal. On ACE inhibitor. Lab was normal 12/14/16. 12/21/2017:  blood pressure is at goal. He is on ACE inhibitor. Check lab to monitor.   GERD At [place] 11/2014--He was to stop eating and  drinking several hours prior to going to bed.  At [place] 02/2015 he reports he did make this change and symptoms have resolved.    Screening for colorectal cancer At prior office visit we discussed the fact that his last colonoscopy was 10 years ago.  At [place] 10/2013 I  asked him about this and he said he called his insurance and they were going to cover 80% but not the full cost. He stated that he cannot afford this. He was agreeable to do Hemoccults ---He did turn in Hemoccult x3 ---- they were negative x3.---10/31/2013 06/01/2015--He is agreeable to do another set of HemeOccult Cards----------- he did these and these were negative 3-------06/2015 03/22/17 -- discussed Cologuard--he is interested in doing this---given to him today --------ADDENDUM ADDED 05/01/2017---COLOGUARD RESULT CAME BACK POSITIVE---ON 04/27/2017 REFERRAL WAS PUT IN FOR COLONOSCOPY. --------------------------------- 06/22/17 reviewed that cologuard came back positive. He states that he is scheduled for colonoscopy October 20 with [facility] 09/21/2017: He had colonoscopy on 07/18/17.  In epic.  Prostate Cancer Screening: No further screening indicated given advanced age   Immunizations:  Influenza vaccine: 06/2016, 06/2017 Tetanus: This was updated at our visit 07/25/13 Pneumonia vaccine: He was given Pneumovax 23 here-- 07/25/13.     PREVNAR 13---given here 08/14/2014 Zostavax: Discussed this at the last visit. He says that he checked and his cost is going to be $95 and he defers. Shingrix: At [place] 03/22/2017 I wrote this on his AVS and told him to check with his insurance regarding their coverage and what his cost would be. He will then let us know this information. 06/22/2017--- he checked with insurance for cost of both the Zostavax and the shingrix and both are too expensive for him so he defers getting either of these now.   Routine Office visit 3 months or sooner if needed.    Signed, [id number] [employer], [place],  [facility] 12/21/2017 8:20 AM

## 2017-12-22 LAB — LIPID PANEL
CHOLESTEROL: 133 mg/dL (ref <200)
HDL: 50 mg/dL (ref >40)
LDL CHOLESTEROL (CALC): 61 mg/dL
Non-HDL Cholesterol (Calc): 83 mg/dL (calc) (ref <130)
TRIGLYCERIDES: 136 mg/dL (ref <150)
Total CHOL/HDL Ratio: 2.7 (calc) (ref <5.0)

## 2017-12-22 LAB — HEMOGLOBIN A1C
EAG (MMOL/L): 8.4 (calc)
HEMOGLOBIN A1C: 6.9 %{Hb} — AB (ref <5.7)
MEAN PLASMA GLUCOSE: 151 (calc)

## 2017-12-22 LAB — COMPLETE METABOLIC PANEL WITH GFR
AG Ratio: 1.2 (calc) (ref 1.0–2.5)
ALKALINE PHOSPHATASE (APISO): 148 U/L — AB (ref 40–115)
ALT: 39 U/L (ref 9–46)
AST: 24 U/L (ref 10–35)
Albumin: 4.2 g/dL (ref 3.6–5.1)
BILIRUBIN TOTAL: 0.5 mg/dL (ref 0.2–1.2)
BUN/Creatinine Ratio: 14 (calc) (ref 6–22)
BUN: 17 mg/dL (ref 7–25)
CHLORIDE: 100 mmol/L (ref 98–110)
CO2: 26 mmol/L (ref 20–32)
CREATININE: 1.23 mg/dL — AB (ref 0.70–1.18)
Calcium: 10 mg/dL (ref 8.6–10.3)
GFR, Est African American: 65 mL/min/{1.73_m2} (ref >=60)
GFR, Est Non African American: 56 mL/min/{1.73_m2} — ABNORMAL LOW (ref >=60)
Globulin: 3.4 g/dL (calc) (ref 1.9–3.7)
Glucose, Bld: 160 mg/dL — ABNORMAL HIGH (ref 65–99)
Potassium: 4.2 mmol/L (ref 3.5–5.3)
Sodium: 137 mmol/L (ref 135–146)
Total Protein: 7.6 g/dL (ref 6.1–8.1)

## 2018-01-03 ENCOUNTER — Telehealth: Payer: Self-pay

## 2018-01-03 MED ORDER — LEVOFLOXACIN 750 MG PO TABS: 750.0000 mg | ORAL_TABLET | Freq: Every day | ORAL | 0 refills | Status: DC

## 2018-01-03 NOTE — Telephone Encounter (Signed)
Patient called requesting another round of antibiotic he is still sick with headache and coughing up green phlegm, Patient denies having a fever. Pls advise

## 2018-01-03 NOTE — Telephone Encounter (Signed)
Call placed to patient lvm that medication would be sent to pharmacy

## 2018-01-03 NOTE — Telephone Encounter (Signed)
Levaquin 750 mg 1 p.o. daily  For 7 days---------- dispense #7+0.

## 2018-01-08 ENCOUNTER — Other Ambulatory Visit: Payer: Self-pay | Admitting: Physician Assistant

## 2018-01-16 DIAGNOSIS — E119 Type 2 diabetes mellitus without complications: Secondary | ICD-10-CM | POA: Diagnosis not present

## 2018-01-16 DIAGNOSIS — H401131 Primary open-angle glaucoma, bilateral, mild stage: Secondary | ICD-10-CM | POA: Diagnosis not present

## 2018-03-20 ENCOUNTER — Other Ambulatory Visit: Payer: Self-pay | Admitting: Physician Assistant

## 2018-03-20 DIAGNOSIS — R69 Illness, unspecified: Secondary | ICD-10-CM | POA: Diagnosis not present

## 2018-03-22 ENCOUNTER — Ambulatory Visit (INDEPENDENT_AMBULATORY_CARE_PROVIDER_SITE_OTHER): Payer: Medicare HMO | Admitting: Physician Assistant

## 2018-03-22 ENCOUNTER — Encounter: Payer: Self-pay | Admitting: Physician Assistant

## 2018-03-22 ENCOUNTER — Other Ambulatory Visit: Payer: Self-pay

## 2018-03-22 VITALS — BP 130/78 | HR 79 | Temp 98.0°F | Resp 16 | Ht 71.0 in | Wt 177.0 lb

## 2018-03-22 DIAGNOSIS — E785 Hyperlipidemia, unspecified: Secondary | ICD-10-CM

## 2018-03-22 DIAGNOSIS — K219 Gastro-esophageal reflux disease without esophagitis: Secondary | ICD-10-CM

## 2018-03-22 DIAGNOSIS — E119 Type 2 diabetes mellitus without complications: Secondary | ICD-10-CM

## 2018-03-22 DIAGNOSIS — I10 Essential (primary) hypertension: Secondary | ICD-10-CM

## 2018-03-22 NOTE — Progress Notes (Signed)
Patient ID: Aaron Mcfarland MRN: [id number], DOB: 08-Oct-1940, 77 y.o. Date of Encounter: @DATE @  Chief Complaint:  Chief Complaint  Patient presents with  . 3 month follow up  . Diabetes  . Hyperlipidemia    HPI: 77 y.o. year old AA male  presents for above.  I saw him as a new patient to our office on 04/24/13. At that time he reported that his prior PCP, who was in [place], had retired 3 months prior. He reported that his last visit there was April 2014. He reported that his wife, Aaron Mcfarland, recently became a patient at this office and had been pleased. She recommended that he come to our office also since his PCP retired. He worked at [employer]. Played softball until age 49!!! 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  Depending on the weather--he either --- walks 40 minutes twice a day. He does this walking with his with his wife----OR------he and his wife walk once a day around 2 PM. Then he also does elliptical and bicycle for 15 minutes per day and uses a rowing machine.  Does exercise 5 days per week.  He checks his blood pressure every day and gets good readings. He was checking his blood sugar every morning and was getting between 90-120. I told him he did not have to cont to check every day--he has decreased to checking twice a week--gets around 110-130.   At prior lab we have told him to cut his Lipitor dose in half ---and only take Mon/Wed/Fri--- he says that he is taking this with these directions.    As well at last lab we have told him to cut his metformin down in half from 1000 twice a day to 500 twice a day. He also reports that he did make this change.  Also at last office visit I added aspirin 81 mg daily. He states that he did add this medication and is taking it as directed.  Taking  all medications as directed with no adverse effects.  At [place] 11/2014-- he reported that at night he felt acid come up in  his throat.  Never happened during the day. Only at night.  Was eaating "dinner "  At 6 pm.  Ate a sandwich at 10:00. Goes to bed at midnight.  When in bed, was feeling this acid. Often he was woken several hours after going to bed with acid coming up in his throat.  At [place] 02/2015 he reports he stopped eating the sandwich at 10pm. "I just eat enough at dinner that I'm not hungry later, now"---reflux has stopped.      At [place] 12/02/2015:  He reports the following: Says that after the antibiotics I prescribed last time, his symptoms cleared and stayed resolved for 2 weeks. However, after that, started developing symptoms again. Says that he feels drainage in his throat. It feels like it is coming down from his nose. Says that when he blows his nose it is watery clear. Says that there is no thick dark mucus. He is not feeling any congestion in his chest. Has had no fevers or chills. Says he has been using Equate brand medicine similar to mucinex-DM--says has guafennisin.  Discussed with him that I see Zyrtec on his medication list and asked if he is currently taking this. Replies that he only takes this as needed for seasonal allergies --that he has allergy to  [place] and starts naming other specific allergies. No other complaints or concerns at this visit today. Diabetic medications as directed with no adverse effects. In his Lipitor as directed above with no adverse effects.  OV 03/14/2016: At [place] 12/2015 A1C was slightly high and had been creeping up. At that time -- increased Metformin from 572m 1 BID to 2 in am and 1 pm.  At [place] 03/14/2016--he says he did increase / adjust the dose and is taking it correctly/as directed.  Checks BS ~ twice a week. Getting good readings. 93 this morning.  Taking Lipitor 174m No myalgias or other adverse effects.   OV 12/14/2016: He is taking diabetic medications as directed. He still checks blood sugar about twice a week. Continuing to get good readings. He continues to  stay very physically active and is very compliant with diet. Verified that he is taking metformin 2 in the morning and 1 in the evening. This is causing no adverse effects. He is taking the Lipitor. No myalgias or other adverse effects. He is taking blood pressure medications as directed. No lightheadedness or other adverse effects. Today he says that he "has been feeling great!"  03/22/2017: Again today, he says that he has been feeling great!!! He continues to stay very physically active and very compliant with his diet. He is taking diabetic medications as directed. Taking metformin 2 in the morning and 1 in the evening. This is causing no adverse effects. He continues to check blood sugar 2 times per week and is continuing to get good readings. He is taking the Lipitor. No myalgias or other adverse effects. He is taking his blood pressure medications as directed. No lightheadedness or other adverse effects. AT [place] 03/22/2017--CHECKING A1C, COLOGUARD, SHINGRIX---SEE A/P FOR DETAILS   06/22/2017: Today reviewed that at his last visit A1c came back 5.9. At that time told him to decrease metformin from taking 2 in the morning 1 in the evening to taking 1 twice daily. Today he verifies that he did make this change and is now taking 1 twice a day. Again today he says that he continues to feel good. He has no complaints or concerns to address today. He is taking the Lipitor. No myalgias or other adverse effects. He is taking his blood pressure medications as directed. No lightheadedness or other adverse effects. After last visit he did the Cologuard and this came back positive. Today he reports that he is scheduled for colonoscopy October 20 with [facility]. After last visit he did check on cost of shingrix and the cost was $142. Says that even the other one Zostavax was going to be $80 so he is not going to either of these now secondary to cost. Last visit he told me he had chronic scattered Sam  scheduled for August. Says he ended up having a funeral that day and is going to reschedule that eye exam. He has already had flu vaccine.   09/21/2017: Today patient reports that he has been having cough and chest congestion for 2 or 3 weeks.  Says that he really has not had much congestion in his head or nose that it is all seem to be mostly cough.   Has had no fever. Reports that he had his eye exam last Thursday and they told him that that looked good. He has no other concerns to address today. I asked about his Christmas plans.  He states that everyone comes to their house.  He has multiple children and  grandchildren and even has 3 great grandchildren who are ages 63, 64, 12. !!!! They will all be there at his house !! He is taking the Lipitor. No myalgias or other adverse effects. He is taking his blood pressure medications as directed. No lightheadedness or other adverse effects.   12/21/2017: Today he reports that both he and his wife have been having this congestion for 2 weeks.  Dates that he is still coughing up green phlegm.  Also getting thick dark mucus out of his nose.  Had no significant sore throat.  No fevers or chills. Reports that otherwise up until having this congestion he had been doing well and has no other concerns to address today.  Everything else has seemed to be stable. He is taking the Lipitor. No myalgias or other adverse effects. He is taking his blood pressure medications as directed. No lightheadedness or other adverse effects.  03/22/2018: He is taking metformin as directed.  This is causing no adverse effects. He is taking his Lipitor daily.  This is causing no myalgias or other adverse effects. He is taking his benazepril and Maxide.  He is having no lightheadedness or other adverse effects. He is taking his aspirin 81 mg daily.  No melena or hematochezia.  No other bleeding. He reports that 4 days a week they keep their 57-year-old great granddaughter.  Says  that sometimes she wants to get on the exercise equipment with him. Says that he has a treadmill, an elliptical, and a stationary bike.  Says that he uses 1 of these 5 days a week.  Just decides what he is in the mood to do that day.  Says that he also walks 1-1/2 miles 3 or 4 times per week.    Past Medical History:  Diagnosis Date  . Allergy   . Cataract    right eye with lens implant   . Diabetes mellitus without complication ([place])   . GERD (gastroesophageal reflux disease)    some  . Glaucoma    left eye  . Hyperlipidemia   . Hypertension      Home Meds:  Outpatient Medications Prior to Visit  Medication Sig Dispense Refill  . aspirin 81 MG tablet Take 1 tablet (81 mg total) by mouth daily. 30 tablet 11  . atorvastatin (LIPITOR) 10 MG tablet TAKE ONE TABLET BY MOUTH ONCE DAILY WITH BREAKFAST 90 tablet 0  . benazepril (LOTENSIN) 10 MG tablet TAKE 1 TABLET BY MOUTH ONCE DAILY 30 tablet 3  . Blood Glucose Monitoring Suppl (ONE TOUCH ULTRA SYSTEM KIT) W/DEVICE KIT 1 kit by Does not apply route once. 1 each 0  . Fluticasone Propionate (FLONASE ALLERGY RELIEF NA) Place 2 sprays into the nose daily. Buying OTC    . glucose blood (ONE TOUCH ULTRA TEST) test strip CHECK BLOOD SUGAR EACH DAY FASTING 50 each 2  . latanoprost (XALATAN) 0.005 % ophthalmic solution Place 1 drop into the left eye at bedtime.    . metFORMIN (GLUCOPHAGE) 500 MG tablet Take 1 tablet by mouth twice a day. 90 tablet 0  . Multiple Vitamins-Minerals (MULTIVITAMIN PO) Take by mouth.    Glory Rosebush DELICA LANCETS FINE MISC CHECK BLOOD SUGAR ONCE DAILY FASTING 100 each 2  . triamterene-hydrochlorothiazide (MAXZIDE-25) 37.5-25 MG tablet TAKE 1 TABLET BY MOUTH ONCE DAILY 90 tablet 0  . levofloxacin (LEVAQUIN) 750 MG tablet Take 1 tablet (750 mg total) by mouth daily. 7 tablet 0   Facility-Administered Medications Prior to Visit  Medication  Dose Route Frequency Provider Last Rate Last Dose  . 0.9 %  sodium chloride  infusion  500 mL Intravenous Continuous Pyrtle, Lajuan Lines, MD         Allergies: No Known Allergies  Social History   Socioeconomic History  . Marital status: Married    Spouse name: Not on file  . Number of children: Not on file  . Years of education: Not on file  . Highest education level: Not on file  Occupational History  . Not on file  Social Needs  . Financial resource strain: Not on file  . Food insecurity:    Worry: Not on file    Inability: Not on file  . Transportation needs:    Medical: Not on file    Non-medical: Not on file  Tobacco Use  . Smoking status: Former Smoker    Last attempt to quit: 04/24/1966    Years since quitting: 51.9  . Smokeless tobacco: Never Used  Substance and Sexual Activity  . Alcohol use: Yes  . Drug use: No  . Sexual activity: Yes    Birth control/protection: None  Lifestyle  . Physical activity:    Days per week: Not on file    Minutes per session: Not on file  . Stress: Not on file  Relationships  . Social connections:    Talks on phone: Not on file    Gets together: Not on file    Attends religious service: Not on file    Active member of club or organization: Not on file    Attends meetings of clubs or organizations: Not on file    Relationship status: Not on file  . Intimate partner violence:    Fear of current or ex partner: Not on file    Emotionally abused: Not on file    Physically abused: Not on file    Forced sexual activity: Not on file  Other Topics Concern  . Not on file  Social History Narrative  . Not on file    Family History  Problem Relation Age of Onset  . Cancer Mother        ovarian  . Ovarian cancer Mother   . Hypertension Father   . Stroke Father   . Diabetes Brother   . Heart disease Brother   . Stroke Brother   . Colon cancer Neg Hx   . Colon polyps Neg Hx   . Esophageal cancer Neg Hx   . Rectal cancer Neg Hx   . Stomach cancer Neg Hx      Review of Systems:  See HPI for pertinent  ROS. All other ROS negative.     Physical Exam: Blood pressure 130/78, pulse 79, temperature 98 F (36.7 C), temperature source Oral, resp. rate 16, height 5' 11"  (1.803 m), weight 80.3 kg (177 lb), SpO2 98 %., Body mass index is 24.69 kg/m. General: WNWD AAM. Appears in no acute distress.  Neck: Supple. No thyromegaly. No lymphadenopathy.  No carotid bruit. Lungs: Clear bilaterally to auscultation without wheezes, rales, or rhonchi. Breathing is unlabored. Heart: RRR with S1 S2. No murmurs, rubs, or gallops. Abdomen: Soft, non-tender, non-distended with normoactive bowel sounds. No hepatomegaly. No rebound/guarding. No obvious abdominal masses. Musculoskeletal:  Strength and tone normal for age. Extremities/Skin: Warm and dry.  No LE edema.  Neuro: Alert and oriented X 3. Moves all extremities spontaneously. Gait is normal. CNII-XII grossly in tact. Psych:  Responds to questions appropriately with a normal affect.  Diabetic foot exam: Inspection is normal. No other wounds and no calluses or concerning areas. Sensation intact throughout. He has 2+ dorsalis pedis and posterior tibial pulses bilaterally.      ASSESSMENT AND PLAN:  77 y.o. year old male with    Diabetes mellitus without complication  He is on statin. He is now on ACE Inhibitor-- benazepril 77m QD.  He is now on Aspirin 812m    03/22/2018: Diabetes has been very well controlled.  Continue current medications as well as his exercise and diet..  Check A1c.  Hyperlipidemia Lipids excellent and we actually had him decrease his Lipitor to half of a pill on Monday Wednesday Friday only and he is taking this dose. Had FLP/LFT 11/2014--LDL 36. HDL 56----Excellent Had FLP/LFT 06/01/15--LDL 60. --Excellent Had FLP/LFT 12/2015---LDL 50--excellent Had FLP/LFT 06/2016----Excellent 12/14/2016: Recheck FLP/LFT 03/22/17 he had lipid panel on 12/1416 which was excellent. HDL 70 and LDL 73. Continue current medication  Lipitor. 06/22/17--he is fasting. Recheck FLP, LFT now. He is on Lipitor. 09/21/2017: Lipid Panel was excellent 9/18 with LDL 45.  LFTs were normal.  Can wait to recheck lab. 12/21/2017: Is fasting.  Recheck FLP/LFT to monitor. 03/22/2018: [place] showed LDL 61.  Continue current dose of Lipitor.  Hypertension He is now on ACE inhibitor. 12/14/2016: He is on ACE Inh. Blood Pressure at goal. Check lab to monitor. 03/22/17: Blood pressure is at goal. On ACE inhibitor. Lab was normal 12/14/16. 12/21/2017:  blood pressure is at goal. He is on ACE inhibitor. Check lab to monitor. 03/22/2018: Blood Pressure is at goal.  He is on ACE inhibitor.  This is stable and controlled.  Continue current medications the same.  GERD At [place] 11/2014--He was to stop eating and drinking several hours prior to going to bed.  [place] he reports he did make this change and symptoms have resolved.    Screening for colorectal cancer At prior office visit we discussed the fact that his last colonoscopy was 10 years ago.  [place] I  asked him about this and he said he called his insurance and they were going to cover 80% but not the full cost. He stated that he cannot afford this. He was agreeable to do Hemoccults ---He did turn in Hemoccult x3 ---- they were negative x3.---10/31/2013 06/01/2015--He is agreeable to do another set of HemeOccult Cards----------- he did these and these were negative 3-------06/2015 03/22/17 -- discussed Cologuard--he is interested in doing this---given to him today --------ADDENDUM ADDED 05/01/2017---COLOGUARD RESULT CAME BACK POSITIVE---ON 04/27/2017 REFERRAL WAS PUT IN FOR COLONOSCOPY. --------------------------------- 06/22/17 reviewed that cologuard came back positive. He states that he is scheduled for colonoscopy October 20 with [facility] 09/21/2017: He had colonoscopy on 07/18/17.  In epic. 03/22/2018: This up to date. He had colonoscopy on 07/18/17.  In epic.   Prostate Cancer  Screening: No further screening indicated given advanced age   Immunizations:  Influenza vaccine: 06/2016, 06/2017 Tetanus: This was updated at our visit 07/25/13 Pneumonia vaccine: He was given Pneumovax 23 here-- 07/25/13.     PREVNAR 13---given here 08/14/2014 Zostavax: Discussed this at the last visit. He says that he checked and his cost is going to be $95 and he defers. Shingrix: [place] I wrote this on his AVS and told him to check with his insurance regarding their coverage and what his cost would be. He will then let usKoreanow this information. 06/22/2017--- he checked with insurance for cost of both the Zostavax  and the shingrix and both are too expensive for him so he defers getting either of these now.   Routine Office visit 3 months or sooner if needed.    Signed, [address] [place], [place], [facility] 03/22/2018 9:08 AM

## 2018-03-23 LAB — HEMOGLOBIN A1C
EAG (MMOL/L): 7.1 (calc)
HEMOGLOBIN A1C: 6.1 %{Hb} — AB (ref <5.7)
MEAN PLASMA GLUCOSE: 128 (calc)

## 2018-05-01 ENCOUNTER — Other Ambulatory Visit: Payer: Self-pay | Admitting: Physician Assistant

## 2018-05-28 ENCOUNTER — Other Ambulatory Visit: Payer: Self-pay | Admitting: Physician Assistant

## 2018-06-08 DIAGNOSIS — R69 Illness, unspecified: Secondary | ICD-10-CM | POA: Diagnosis not present

## 2018-06-27 ENCOUNTER — Ambulatory Visit (INDEPENDENT_AMBULATORY_CARE_PROVIDER_SITE_OTHER): Payer: Medicare HMO | Admitting: Physician Assistant

## 2018-06-27 ENCOUNTER — Encounter: Payer: Self-pay | Admitting: Physician Assistant

## 2018-06-27 VITALS — BP 142/84 | HR 63 | Temp 98.0°F | Resp 16 | Ht 71.0 in | Wt 177.4 lb

## 2018-06-27 DIAGNOSIS — E785 Hyperlipidemia, unspecified: Secondary | ICD-10-CM | POA: Diagnosis not present

## 2018-06-27 DIAGNOSIS — K219 Gastro-esophageal reflux disease without esophagitis: Secondary | ICD-10-CM

## 2018-06-27 DIAGNOSIS — I10 Essential (primary) hypertension: Secondary | ICD-10-CM

## 2018-06-27 DIAGNOSIS — E119 Type 2 diabetes mellitus without complications: Secondary | ICD-10-CM | POA: Diagnosis not present

## 2018-06-27 DIAGNOSIS — Z1212 Encounter for screening for malignant neoplasm of rectum: Secondary | ICD-10-CM | POA: Diagnosis not present

## 2018-06-27 DIAGNOSIS — Z1211 Encounter for screening for malignant neoplasm of colon: Secondary | ICD-10-CM

## 2018-06-27 NOTE — Progress Notes (Signed)
Patient ID: Renard Caperton MRN: [id number], DOB: 29-Aug-1941, 77 y.o. Date of Encounter: @DATE @  Chief Complaint:  Chief Complaint  Patient presents with  . buzzing left ear  . 3 month follow up  . Hypertension  . Diabetes  . Hyperlipidemia    HPI: 77 y.o. year old AA male  presents for above.  I saw him as a new patient to our office on 04/24/13. At that time he reported that his prior PCP, who was in [place], had retired 3 months prior. He reported that his last visit there was April 2014. He reported that his wife, [name], recently became a patient at this office and had been pleased. She recommended that he come to our office also since his PCP retired. He worked at [employer]. Played softball until age 18!!! 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  Depending on the weather--he either --- walks 40 minutes twice a day. He does this walking with his with his wife----OR------he and his wife walk once a day around 2 PM. Then he also does elliptical and bicycle for 15 minutes per day and uses a rowing machine.  Does exercise 5 days per week.  He checks his blood pressure every day and gets good readings. He was checking his blood sugar every morning and was getting between 90-120. I told him he did not have to cont to check every day--he has decreased to checking twice a week--gets around 110-130.   At prior lab we have told him to cut his Lipitor dose in half ---and only take Mon/Wed/Fri--- he says that he is taking this with these directions.    As well at last lab we have told him to cut his metformin down in half from 1000 twice a day to 500 twice a day. He also reports that he did make this change.  Also at last office visit I added aspirin 81 mg daily. He states that he did add this medication and is taking it as directed.  Taking  all medications as directed with no adverse effects.  At [place] 11/2014-- he reported  that at night he felt acid come up in his throat.  Never happened during the day. Only at night.  Was eaating "dinner "  At 6 pm.  Ate a sandwich at 10:00. Goes to bed at midnight.  When in bed, was feeling this acid. Often he was woken several hours after going to bed with acid coming up in his throat.  At [place] 02/2015 he reports he stopped eating the sandwich at 10pm. "I just eat enough at dinner that I'm not hungry later, now"---reflux has stopped.      At [place] 12/02/2015:  He reports the following: Says that after the antibiotics I prescribed last time, his symptoms cleared and stayed resolved for 2 weeks. However, after that, started developing symptoms again. Says that he feels drainage in his throat. It feels like it is coming down from his nose. Says that when he blows his nose it is watery clear. Says that there is no thick dark mucus. He is not feeling any congestion in his chest. Has had no fevers or chills. Says he has been using Equate brand medicine similar to mucinex-DM--says has guafennisin.  Discussed with him that I see Zyrtec on his medication list and asked if he is currently taking this. Replies that he only takes this as needed  for seasonal allergies --that he has allergy to [facility] and starts naming other specific allergies. No other complaints or concerns at this visit today. Diabetic medications as directed with no adverse effects. In his Lipitor as directed above with no adverse effects.  OV 03/14/2016: At [place] 12/2015 A1C was slightly high and had been creeping up. At that time -- increased Metformin from 563m 1 BID to 2 in am and 1 pm.  At [place] 03/14/2016--he says he did increase / adjust the dose and is taking it correctly/as directed.  Checks BS ~ twice a week. Getting good readings. 93 this morning.  Taking Lipitor 179m No myalgias or other adverse effects.   OV 12/14/2016: He is taking diabetic medications as directed. He still checks blood sugar about twice a week. Continuing  to get good readings. He continues to stay very physically active and is very compliant with diet. Verified that he is taking metformin 2 in the morning and 1 in the evening. This is causing no adverse effects. He is taking the Lipitor. No myalgias or other adverse effects. He is taking blood pressure medications as directed. No lightheadedness or other adverse effects. Today he says that he "has been feeling great!"  03/22/2017: Again today, he says that he has been feeling great!!! He continues to stay very physically active and very compliant with his diet. He is taking diabetic medications as directed. Taking metformin 2 in the morning and 1 in the evening. This is causing no adverse effects. He continues to check blood sugar 2 times per week and is continuing to get good readings. He is taking the Lipitor. No myalgias or other adverse effects. He is taking his blood pressure medications as directed. No lightheadedness or other adverse effects. AT [place] 03/22/2017--CHECKING A1C, COLOGUARD, SHINGRIX---SEE A/P FOR DETAILS   06/22/2017: Today reviewed that at his last visit A1c came back 5.9. At that time told him to decrease metformin from taking 2 in the morning 1 in the evening to taking 1 twice daily. Today he verifies that he did make this change and is now taking 1 twice a day. Again today he says that he continues to feel good. He has no complaints or concerns to address today. He is taking the Lipitor. No myalgias or other adverse effects. He is taking his blood pressure medications as directed. No lightheadedness or other adverse effects. After last visit he did the Cologuard and this came back positive. Today he reports that he is scheduled for colonoscopy October 20 with [facility]. After last visit he did check on cost of shingrix and the cost was $142. Says that even the other one Zostavax was going to be $80 so he is not going to either of these now secondary to cost. Last visit he  told me he had chronic scattered Sam scheduled for August. Says he ended up having a funeral that day and is going to reschedule that eye exam. He has already had flu vaccine.   09/21/2017: Today patient reports that he has been having cough and chest congestion for 2 or 3 weeks.  Says that he really has not had much congestion in his head or nose that it is all seem to be mostly cough.   Has had no fever. Reports that he had his eye exam last Thursday and they told him that that looked good. He has no other concerns to address today. I asked about his Christmas plans.  He states that everyone comes to  their house.  He has multiple children and grandchildren and even has 3 great grandchildren who are ages 65, 22, 10. !!!! They will all be there at his house !! He is taking the Lipitor. No myalgias or other adverse effects. He is taking his blood pressure medications as directed. No lightheadedness or other adverse effects.   12/21/2017: Today he reports that both he and his wife have been having this congestion for 2 weeks.  Dates that he is still coughing up green phlegm.  Also getting thick dark mucus out of his nose.  Had no significant sore throat.  No fevers or chills. Reports that otherwise up until having this congestion he had been doing well and has no other concerns to address today.  Everything else has seemed to be stable. He is taking the Lipitor. No myalgias or other adverse effects. He is taking his blood pressure medications as directed. No lightheadedness or other adverse effects.  03/22/2018: He is taking metformin as directed.  This is causing no adverse effects. He is taking his Lipitor daily.  This is causing no myalgias or other adverse effects. He is taking his benazepril and Maxide.  He is having no lightheadedness or other adverse effects. He is taking his aspirin 81 mg daily.  No melena or hematochezia.  No other bleeding. He reports that 4 days a week they keep their  65-year-old great granddaughter.  Says that sometimes she wants to get on the exercise equipment with him. Says that he has a treadmill, an elliptical, and a stationary bike.  Says that he uses 1 of these 5 days a week.  Just decides what he is in the mood to do that day.  Says that he also walks 1-1/2 miles 3 or 4 times per week.   06/27/2018: He is taking metformin as directed.  This is causing no adverse effects. He is taking his Lipitor daily.  This is causing no myalgias or other adverse effects. He is taking his benazepril and Maxide.  He is having no lightheadedness or other adverse effects. He is taking his aspirin 81 mg daily.  No melena or hematochezia.  No other bleeding. He reports that their 74-year-old great granddaughter started pre-K so is going to school now so they aren't seeing her every day. However, says she still goes to their house on the weekends--Says his wife picks her up on Friday afternoon and she stays with them through Sunday!!!.  He is continuing his exercise----Says that he has a treadmill, an elliptical, and a stationary bike.  Says that he uses 1 of these 5 days a week.  Just decides what he is in the mood to do that day.  Says that he also walks 1-1/2 miles 3 or 4 times per week. Says that he has one thing for me to check.  Says that recently he has been trying to clean out his left ear and has been irrigating it at home.  However still having a buzzing sound in it recent.  No other specific concerns to evaluate. Says that he and his wife have already gotten their flu shot.      Past Medical History:  Diagnosis Date  . Allergy   . Cataract    right eye with lens implant   . Diabetes mellitus without complication ([place])   . GERD (gastroesophageal reflux disease)    some  . Glaucoma    left eye  . Hyperlipidemia   . Hypertension  Home Meds:  Outpatient Medications Prior to Visit  Medication Sig Dispense Refill  . aspirin 81 MG tablet Take 1 tablet  (81 mg total) by mouth daily. 30 tablet 11  . atorvastatin (LIPITOR) 10 MG tablet TAKE ONE TABLET BY MOUTH ONCE DAILY WITH BREAKFAST 90 tablet 0  . benazepril (LOTENSIN) 10 MG tablet TAKE 1 TABLET BY MOUTH ONCE DAILY 30 tablet 3  . Blood Glucose Monitoring Suppl (ONE TOUCH ULTRA SYSTEM KIT) W/DEVICE KIT 1 kit by Does not apply route once. 1 each 0  . Fluticasone Propionate (FLONASE ALLERGY RELIEF NA) Place 2 sprays into the nose daily. Buying OTC    . glucose blood (ONE TOUCH ULTRA TEST) test strip CHECK BLOOD SUGAR EACH DAY FASTING 50 each 2  . latanoprost (XALATAN) 0.005 % ophthalmic solution Place 1 drop into the left eye at bedtime.    . metFORMIN (GLUCOPHAGE) 500 MG tablet Take 1 tablet by mouth twice a day. 90 tablet 0  . Multiple Vitamins-Minerals (MULTIVITAMIN PO) Take by mouth.    Glory Rosebush DELICA LANCETS FINE MISC CHECK BLOOD SUGAR ONCE DAILY FASTING 100 each 2  . triamterene-hydrochlorothiazide (MAXZIDE-25) 37.5-25 MG tablet TAKE 1 TABLET BY MOUTH ONCE DAILY 90 tablet 0  . metFORMIN (GLUCOPHAGE) 500 MG tablet TAKE 1 TABLET BY MOUTH TWICE DAILY 180 tablet 1   Facility-Administered Medications Prior to Visit  Medication Dose Route Frequency Provider Last Rate Last Dose  . 0.9 %  sodium chloride infusion  500 mL Intravenous Continuous Pyrtle, Lajuan Lines, MD         Allergies: No Known Allergies  Social History   Socioeconomic History  . Marital status: Married    Spouse name: Not on file  . Number of children: Not on file  . Years of education: Not on file  . Highest education level: Not on file  Occupational History  . Not on file  Social Needs  . Financial resource strain: Not on file  . Food insecurity:    Worry: Not on file    Inability: Not on file  . Transportation needs:    Medical: Not on file    Non-medical: Not on file  Tobacco Use  . Smoking status: Former Smoker    Last attempt to quit: 04/24/1966    Years since quitting: 52.2  . Smokeless tobacco: Never Used   Substance and Sexual Activity  . Alcohol use: Yes  . Drug use: No  . Sexual activity: Yes    Birth control/protection: None  Lifestyle  . Physical activity:    Days per week: Not on file    Minutes per session: Not on file  . Stress: Not on file  Relationships  . Social connections:    Talks on phone: Not on file    Gets together: Not on file    Attends religious service: Not on file    Active member of club or organization: Not on file    Attends meetings of clubs or organizations: Not on file    Relationship status: Not on file  . Intimate partner violence:    Fear of current or ex partner: Not on file    Emotionally abused: Not on file    Physically abused: Not on file    Forced sexual activity: Not on file  Other Topics Concern  . Not on file  Social History Narrative  . Not on file    Family History  Problem Relation Age of Onset  . Cancer Mother  ovarian  . Ovarian cancer Mother   . Hypertension Father   . Stroke Father   . Diabetes Brother   . Heart disease Brother   . Stroke Brother   . Colon cancer Neg Hx   . Colon polyps Neg Hx   . Esophageal cancer Neg Hx   . Rectal cancer Neg Hx   . Stomach cancer Neg Hx      Review of Systems:  See HPI for pertinent ROS. All other ROS negative.     Physical Exam: Blood pressure (!) 142/84, pulse 63, temperature 98 F (36.7 C), temperature source Oral, resp. rate 16, height 5' 11"  (1.803 m), weight 177 lb 6.4 oz (80.5 kg), SpO2 99 %., Body mass index is 24.74 kg/m. General: WNWD AAM. Appears in no acute distress. Head: Normocephalic, atraumatic, eyes without discharge, sclera non-icteric, nares are without discharge. Left Ear Canal obstructed with moist cerumen. Right ear canal and TM normal.  Neck: Supple. No thyromegaly. No lymphadenopathy.  No carotid bruit. Lungs: Clear bilaterally to auscultation without wheezes, rales, or rhonchi. Breathing is unlabored. Heart: RRR with S1 S2. No murmurs, rubs, or  gallops. Abdomen: Soft, non-tender, non-distended with normoactive bowel sounds. No hepatomegaly. No rebound/guarding. No obvious abdominal masses. Musculoskeletal:  Strength and tone normal for age. Extremities/Skin: Warm and dry.  No LE edema. Neuro: Alert and oriented X 3. Moves all extremities spontaneously. Gait is normal. CNII-XII grossly in tact. Psych:  Responds to questions appropriately with a normal affect.     Diabetic foot exam: Inspection is normal. No other wounds and no calluses or concerning areas. Sensation intact throughout. He has 2+ dorsalis pedis and posterior tibial pulses bilaterally.      ASSESSMENT AND PLAN:  77 y.o. year old male with    Diabetes mellitus without complication  He is on statin. He is now on ACE Inhibitor-- benazepril 34m QD.  He is now on Aspirin 819m    06/27/2018: Diabetes has been very well controlled.  Continue current medications as well as his exercise and diet..  Check A1c.  Hyperlipidemia Lipids excellent and we actually had him decrease his Lipitor to half of a pill on Monday Wednesday Friday only and he is taking this dose. Had FLP/LFT 11/2014--LDL 36. HDL 56----Excellent Had FLP/LFT 06/01/15--LDL 60. --Excellent Had FLP/LFT 12/2015---LDL 50--excellent Had FLP/LFT 06/2016----Excellent 12/14/2016: Recheck FLP/LFT 03/22/17 he had lipid panel on 12/1416 which was excellent. HDL 70 and LDL 73. Continue current medication Lipitor. 06/22/17--he is fasting. Recheck FLP, LFT now. He is on Lipitor. 09/21/2017: Lipid Panel was excellent 9/18 with LDL 45.  LFTs were normal.  Can wait to recheck lab. 12/21/2017: Is fasting.  Recheck FLP/LFT to monitor. 03/22/2018: FL[place]/21/2019 showed LDL 61.  Continue current dose of Lipitor. 06/27/2018: Is fasting.  Recheck FLP/LFT to monitor.    Hypertension He is now on ACE inhibitor. 12/14/2016: He is on ACE Inh. Blood Pressure at goal. Check lab to monitor. 03/22/17: Blood pressure is at goal. On ACE  inhibitor. Lab was normal 12/14/16. 12/21/2017:  blood pressure is at goal. He is on ACE inhibitor. Check lab to monitor. 03/22/2018: Blood Pressure is at goal.  He is on ACE inhibitor.  This is stable and controlled.  Continue current medications the same. 06/27/2018: Reviewed the blood pressure slightly elevated today at 142/84.  However at last visit was good at 130/78.  Therefore will continue current medications the same without change.  Check lab to monitor.  He is on ACE inhibitor.  GERD At [place] 11/2014--He was to stop eating and drinking several hours prior to going to bed.  At [place] 02/2015 he reports he did make this change and symptoms have resolved.    Screening for colorectal cancer At prior office visit we discussed the fact that his last colonoscopy was 10 years ago.  At [place] 10/2013 I  asked him about this and he said he called his insurance and they were going to cover 80% but not the full cost. He stated that he cannot afford this. He was agreeable to do Hemoccults ---He did turn in Hemoccult x3 ---- they were negative x3.---10/31/2013 06/01/2015--He is agreeable to do another set of HemeOccult Cards----------- he did these and these were negative 3-------06/2015 03/22/17 -- discussed Cologuard--he is interested in doing this---given to him today --------ADDENDUM ADDED 05/01/2017---COLOGUARD RESULT CAME BACK POSITIVE---ON 04/27/2017 REFERRAL WAS PUT IN FOR COLONOSCOPY. --------------------------------- 06/22/17 reviewed that cologuard came back positive. He states that he is scheduled for colonoscopy October 20 with [facility] 09/21/2017: He had colonoscopy on 07/18/17.  In epic. 03/22/2018: This up to date. He had colonoscopy on 07/18/17.  In epic. 06/27/2018: This up to date. He had colonoscopy on 07/18/17.  In epic.   Prostate Cancer Screening: No further screening indicated given advanced age   Immunizations:  Influenza vaccine: 06/2016, 06/2017, 06/2018 Tetanus: This was updated at our  visit 07/25/13 Pneumonia vaccine: He was given Pneumovax 23 here-- 07/25/13.     PREVNAR 13---given here 08/14/2014 Zostavax: Discussed this at the last visit. He says that he checked and his cost is going to be $95 and he defers. Shingrix: At [place] 03/22/2017 I wrote this on his AVS and told him to check with his insurance regarding their coverage and what his cost would be. He will then let us know this information. 06/22/2017--- he checked with insurance for cost of both the Zostavax and the shingrix and both are too expensive for him so he defers getting either of these now.   Routine Office visit 3 months or sooner if needed.    Marin Olp [place], [place], [facility] 06/27/2018 8:19 AM

## 2018-06-29 LAB — COMPLETE METABOLIC PANEL WITH GFR
AG Ratio: 1.5 (calc) (ref 1.0–2.5)
ALT: 12 U/L (ref 9–46)
AST: 15 U/L (ref 10–35)
Albumin: 4.3 g/dL (ref 3.6–5.1)
Alkaline phosphatase (APISO): 84 U/L (ref 40–115)
BUN/Creatinine Ratio: 13 (calc) (ref 6–22)
BUN: 16 mg/dL (ref 7–25)
CALCIUM: 10 mg/dL (ref 8.6–10.3)
CO2: 24 mmol/L (ref 20–32)
CREATININE: 1.26 mg/dL — AB (ref 0.70–1.18)
Chloride: 105 mmol/L (ref 98–110)
GFR, EST NON AFRICAN AMERICAN: 54 mL/min/{1.73_m2} — AB (ref >=60)
GFR, Est African American: 63 mL/min/{1.73_m2} (ref >=60)
GLOBULIN: 2.9 g/dL (ref 1.9–3.7)
Glucose, Bld: 100 mg/dL — ABNORMAL HIGH (ref 65–99)
Potassium: 4.3 mmol/L (ref 3.5–5.3)
Sodium: 140 mmol/L (ref 135–146)
Total Bilirubin: 0.6 mg/dL (ref 0.2–1.2)
Total Protein: 7.2 g/dL (ref 6.1–8.1)

## 2018-06-29 LAB — LIPID PANEL
CHOL/HDL RATIO: 2.2 (calc) (ref <5.0)
Cholesterol: 138 mg/dL (ref <200)
HDL: 62 mg/dL (ref >40)
LDL CHOLESTEROL (CALC): 62 mg/dL
NON-HDL CHOLESTEROL (CALC): 76 mg/dL (ref <130)
Triglycerides: 60 mg/dL (ref <150)

## 2018-06-29 LAB — MICROALBUMIN, URINE

## 2018-06-29 LAB — HEMOGLOBIN A1C
HEMOGLOBIN A1C: 6.5 %{Hb} — AB (ref <5.7)
MEAN PLASMA GLUCOSE: 140 (calc)
eAG (mmol/L): 7.7 (calc)

## 2018-07-24 ENCOUNTER — Other Ambulatory Visit: Payer: Self-pay | Admitting: Physician Assistant

## 2018-07-31 DIAGNOSIS — H401131 Primary open-angle glaucoma, bilateral, mild stage: Secondary | ICD-10-CM | POA: Diagnosis not present

## 2018-07-31 DIAGNOSIS — E119 Type 2 diabetes mellitus without complications: Secondary | ICD-10-CM | POA: Diagnosis not present

## 2018-07-31 DIAGNOSIS — H2512 Age-related nuclear cataract, left eye: Secondary | ICD-10-CM | POA: Diagnosis not present

## 2018-08-07 ENCOUNTER — Other Ambulatory Visit: Payer: Self-pay | Admitting: Physician Assistant

## 2018-09-05 ENCOUNTER — Other Ambulatory Visit: Payer: Self-pay | Admitting: Family Medicine

## 2018-09-05 MED ORDER — BENAZEPRIL HCL 10 MG PO TABS: 10.0000 mg | ORAL_TABLET | Freq: Every day | ORAL | 3 refills | Status: DC

## 2018-09-11 DIAGNOSIS — H47233 Glaucomatous optic atrophy, bilateral: Secondary | ICD-10-CM | POA: Diagnosis not present

## 2018-09-11 DIAGNOSIS — H401131 Primary open-angle glaucoma, bilateral, mild stage: Secondary | ICD-10-CM | POA: Diagnosis not present

## 2018-09-11 DIAGNOSIS — E119 Type 2 diabetes mellitus without complications: Secondary | ICD-10-CM | POA: Diagnosis not present

## 2018-09-11 DIAGNOSIS — H2512 Age-related nuclear cataract, left eye: Secondary | ICD-10-CM | POA: Diagnosis not present

## 2018-09-21 DIAGNOSIS — H2512 Age-related nuclear cataract, left eye: Secondary | ICD-10-CM | POA: Diagnosis not present

## 2018-09-21 DIAGNOSIS — H401131 Primary open-angle glaucoma, bilateral, mild stage: Secondary | ICD-10-CM | POA: Diagnosis not present

## 2018-10-10 ENCOUNTER — Ambulatory Visit: Payer: Self-pay | Admitting: Physician Assistant

## 2018-10-11 ENCOUNTER — Encounter: Payer: Self-pay | Admitting: Family Medicine

## 2018-10-11 ENCOUNTER — Ambulatory Visit (INDEPENDENT_AMBULATORY_CARE_PROVIDER_SITE_OTHER): Payer: Medicare HMO | Admitting: Family Medicine

## 2018-10-11 VITALS — BP 130/78 | HR 72 | Temp 97.6°F | Resp 16 | Ht 71.0 in | Wt 179.0 lb

## 2018-10-11 DIAGNOSIS — I10 Essential (primary) hypertension: Secondary | ICD-10-CM | POA: Diagnosis not present

## 2018-10-11 DIAGNOSIS — E785 Hyperlipidemia, unspecified: Secondary | ICD-10-CM

## 2018-10-11 DIAGNOSIS — E119 Type 2 diabetes mellitus without complications: Secondary | ICD-10-CM

## 2018-10-11 NOTE — Progress Notes (Signed)
Subjective:    Patient ID: Aaron Mcfarland, male    DOB: 1941/02/22, 78 y.o.   MRN: [id number]  HPI Patient presents today for regular checkup.  Past medical history significant for diabetes mellitus type 2, hypertension, hyperlipidemia.  His last hemoglobin A1c in September was outstanding at 6.5.  He just saw his eye doctor in December and underwent laser surgery for glaucoma.  However there is no evidence of diabetic retinopathy.  Diabetic foot exam was performed today and is normal.  He denies any polyuria polydipsia or blurry vision.  He denies any chest pain shortness of breath or dyspnea on exertion.  He denies any myalgias or right upper quadrant pain.  His flu shot is up-to-date.  He has had Pneumovax as well as Prevnar.  He had a positive Cologuard in 2018 however his follow-up colonoscopy revealed polyps.  He is due for repeat colonoscopy in 2023.  We discussed prostate cancer screening given his age however he elects not to proceed with prostate cancer screening at the present time Past Medical History:  Diagnosis Date  . Allergy   . Cataract    right eye with lens implant   . Diabetes mellitus without complication ([place])   . GERD (gastroesophageal reflux disease)    some  . Glaucoma    left eye  . Hyperlipidemia   . Hypertension    Past Surgical History:  Procedure Laterality Date  . CATARACT EXTRACTION Right    with lens implant 11-03-2014  . COLONOSCOPY  2004   Dr Collene Mares   . TONSILLECTOMY AND ADENOIDECTOMY     age 50   Current Outpatient Medications on File Prior to Visit  Medication Sig Dispense Refill  . aspirin 81 MG tablet Take 1 tablet (81 mg total) by mouth daily. 30 tablet 11  . atorvastatin (LIPITOR) 10 MG tablet TAKE ONE TABLET BY MOUTH ONCE DAILY WITH BREAKFAST 90 tablet 0  . atorvastatin (LIPITOR) 10 MG tablet TAKE 1 TABLET BY MOUTH ONCE DAILY WITH BREAKFAST 90 tablet 1  . benazepril (LOTENSIN) 10 MG tablet Take 1 tablet (10 mg total) by mouth daily. 30  tablet 3  . Blood Glucose Monitoring Suppl (ONE TOUCH ULTRA SYSTEM KIT) W/DEVICE KIT 1 kit by Does not apply route once. 1 each 0  . Fluticasone Propionate (FLONASE ALLERGY RELIEF NA) Place 2 sprays into the nose daily. Buying OTC    . glucose blood (ONE TOUCH ULTRA TEST) test strip CHECK BLOOD SUGAR EACH DAY FASTING 50 each 2  . latanoprost (XALATAN) 0.005 % ophthalmic solution Place 1 drop into the left eye at bedtime.    . metFORMIN (GLUCOPHAGE) 500 MG tablet Take 1 tablet by mouth twice a day. 90 tablet 0  . Multiple Vitamins-Minerals (MULTIVITAMIN PO) Take by mouth.    Glory Rosebush DELICA LANCETS FINE MISC CHECK BLOOD SUGAR ONCE DAILY FASTING 100 each 2  . triamterene-hydrochlorothiazide (MAXZIDE-25) 37.5-25 MG tablet TAKE 1 TABLET BY MOUTH ONCE DAILY 90 tablet 1   Current Facility-Administered Medications on File Prior to Visit  Medication Dose Route Frequency Provider Last Rate Last Dose  . 0.9 %  sodium chloride infusion  500 mL Intravenous Continuous Pyrtle, Lajuan Lines, MD       No Known Allergies Social History   Socioeconomic History  . Marital status: Married    Spouse name: Not on file  . Number of children: Not on file  . Years of education: Not on file  . Highest education level: Not  on file  Occupational History  . Not on file  Social Needs  . Financial resource strain: Not on file  . Food insecurity:    Worry: Not on file    Inability: Not on file  . Transportation needs:    Medical: Not on file    Non-medical: Not on file  Tobacco Use  . Smoking status: Former Smoker    Last attempt to quit: 04/24/1966    Years since quitting: 52.5  . Smokeless tobacco: Never Used  Substance and Sexual Activity  . Alcohol use: Yes  . Drug use: No  . Sexual activity: Yes    Birth control/protection: None  Lifestyle  . Physical activity:    Days per week: Not on file    Minutes per session: Not on file  . Stress: Not on file  Relationships  . Social connections:    Talks on  phone: Not on file    Gets together: Not on file    Attends religious service: Not on file    Active member of club or organization: Not on file    Attends meetings of clubs or organizations: Not on file    Relationship status: Not on file  . Intimate partner violence:    Fear of current or ex partner: Not on file    Emotionally abused: Not on file    Physically abused: Not on file    Forced sexual activity: Not on file  Other Topics Concern  . Not on file  Social History Narrative  . Not on file      Review of Systems  All other systems reviewed and are negative.      Objective:   Physical Exam Vitals signs reviewed.  Constitutional:      General: He is not in acute distress.    Appearance: Normal appearance. He is normal weight. He is not ill-appearing.  Neck:     Vascular: No carotid bruit.  Cardiovascular:     Rate and Rhythm: Normal rate and regular rhythm.     Pulses: Normal pulses.     Heart sounds: Normal heart sounds. No murmur. No friction rub. No gallop.   Pulmonary:     Effort: Pulmonary effort is normal. No respiratory distress.     Breath sounds: Normal breath sounds. No stridor. No wheezing, rhonchi or rales.  Chest:     Chest wall: No tenderness.  Abdominal:     General: Bowel sounds are normal. There is no distension.     Palpations: Abdomen is soft. There is no mass.     Tenderness: There is no abdominal tenderness. There is no right CVA tenderness, left CVA tenderness, guarding or rebound.     Hernia: No hernia is present.  Musculoskeletal:     Right lower leg: No edema.     Left lower leg: No edema.  Lymphadenopathy:     Cervical: No cervical adenopathy.  Neurological:     Mental Status: He is alert.           Assessment & Plan:  Essential hypertension  Diabetes mellitus without complication ([place]) - Plan: Hemoglobin A1c, CBC with Differential/Platelet, COMPLETE METABOLIC PANEL WITH GFR, Lipid panel, Microalbumin,  urine  Hyperlipidemia, unspecified hyperlipidemia type  Blood pressure is outstanding.  We will make no changes in his antihypertensive medication.  Diabetes is been well controlled.  Diabetic eye exam and foot exam are up-to-date.  Immunizations are up-to-date.  I will check a hemoglobin A1c.  Goal hemoglobin  A1c is less than 6.5.  I will also screen a urine microalbumin.  Cholesterol has been well controlled but I will check a fasting lipid panel.  Goal LDL cholesterol is less than 100.  Patient declines PSA screening.  Colonoscopy is up-to-date as is his immunizations.

## 2018-10-12 ENCOUNTER — Other Ambulatory Visit: Payer: Self-pay | Admitting: Family Medicine

## 2018-10-12 DIAGNOSIS — E119 Type 2 diabetes mellitus without complications: Secondary | ICD-10-CM

## 2018-10-12 LAB — CBC WITH DIFFERENTIAL/PLATELET
Absolute Monocytes: 559 cells/uL (ref 200–950)
Basophils Absolute: 23 cells/uL (ref 0–200)
Basophils Relative: 0.4 %
Eosinophils Absolute: 148 cells/uL (ref 15–500)
Eosinophils Relative: 2.6 %
HEMATOCRIT: 41.1 % (ref 38.5–50.0)
Hemoglobin: 14.4 g/dL (ref 13.2–17.1)
Lymphs Abs: 2138 cells/uL (ref 850–3900)
MCH: 33.8 pg — ABNORMAL HIGH (ref 27.0–33.0)
MCHC: 35 g/dL (ref 32.0–36.0)
MCV: 96.5 fL (ref 80.0–100.0)
MPV: 9.6 fL (ref 7.5–12.5)
Monocytes Relative: 9.8 %
Neutro Abs: 2833 cells/uL (ref 1500–7800)
Neutrophils Relative %: 49.7 %
Platelets: 296 10*3/uL (ref 140–400)
RBC: 4.26 10*6/uL (ref 4.20–5.80)
RDW: 12.7 % (ref 11.0–15.0)
Total Lymphocyte: 37.5 %
WBC: 5.7 10*3/uL (ref 3.8–10.8)

## 2018-10-12 LAB — COMPLETE METABOLIC PANEL WITH GFR
AG Ratio: 1.6 (calc) (ref 1.0–2.5)
ALT: 27 U/L (ref 9–46)
AST: 16 U/L (ref 10–35)
Albumin: 4.6 g/dL (ref 3.6–5.1)
Alkaline phosphatase (APISO): 86 U/L (ref 40–115)
BUN/Creatinine Ratio: 15 (calc) (ref 6–22)
BUN: 19 mg/dL (ref 7–25)
CHLORIDE: 104 mmol/L (ref 98–110)
CO2: 27 mmol/L (ref 20–32)
Calcium: 10.4 mg/dL — ABNORMAL HIGH (ref 8.6–10.3)
Creat: 1.31 mg/dL — ABNORMAL HIGH (ref 0.70–1.18)
GFR, Est African American: 60 mL/min/{1.73_m2} (ref >=60)
GFR, Est Non African American: 52 mL/min/{1.73_m2} — ABNORMAL LOW (ref >=60)
Globulin: 2.9 g/dL (calc) (ref 1.9–3.7)
Glucose, Bld: 132 mg/dL — ABNORMAL HIGH (ref 65–99)
Potassium: 4.8 mmol/L (ref 3.5–5.3)
Sodium: 139 mmol/L (ref 135–146)
Total Bilirubin: 0.6 mg/dL (ref 0.2–1.2)
Total Protein: 7.5 g/dL (ref 6.1–8.1)

## 2018-10-12 LAB — MICROALBUMIN, URINE: Microalb, Ur: 0.3 mg/dL

## 2018-10-12 LAB — HEMOGLOBIN A1C
HEMOGLOBIN A1C: 6.7 %{Hb} — AB (ref <5.7)
MEAN PLASMA GLUCOSE: 146 (calc)
eAG (mmol/L): 8.1 (calc)

## 2018-10-12 LAB — LIPID PANEL
Cholesterol: 131 mg/dL (ref <200)
HDL: 62 mg/dL (ref >40)
LDL Cholesterol (Calc): 47 mg/dL (calc)
Non-HDL Cholesterol (Calc): 69 mg/dL (calc) (ref <130)
Total CHOL/HDL Ratio: 2.1 (calc) (ref <5.0)
Triglycerides: 141 mg/dL (ref <150)

## 2018-10-17 DIAGNOSIS — H401131 Primary open-angle glaucoma, bilateral, mild stage: Secondary | ICD-10-CM | POA: Diagnosis not present

## 2018-10-17 DIAGNOSIS — H2512 Age-related nuclear cataract, left eye: Secondary | ICD-10-CM | POA: Diagnosis not present

## 2018-11-01 DIAGNOSIS — R69 Illness, unspecified: Secondary | ICD-10-CM | POA: Diagnosis not present

## 2018-11-09 DIAGNOSIS — H401131 Primary open-angle glaucoma, bilateral, mild stage: Secondary | ICD-10-CM | POA: Diagnosis not present

## 2018-11-09 DIAGNOSIS — H2512 Age-related nuclear cataract, left eye: Secondary | ICD-10-CM | POA: Diagnosis not present

## 2019-01-17 ENCOUNTER — Other Ambulatory Visit: Payer: Self-pay | Admitting: Family Medicine

## 2019-02-06 ENCOUNTER — Other Ambulatory Visit: Payer: Self-pay | Admitting: Family Medicine

## 2019-02-06 MED ORDER — METFORMIN HCL 500 MG PO TABS: ORAL_TABLET | ORAL | 0 refills | Status: DC

## 2019-03-11 ENCOUNTER — Other Ambulatory Visit: Payer: Self-pay | Admitting: Family Medicine

## 2019-03-21 ENCOUNTER — Other Ambulatory Visit: Payer: Self-pay | Admitting: Family Medicine

## 2019-03-21 MED ORDER — BENAZEPRIL HCL 10 MG PO TABS: 10.0000 mg | ORAL_TABLET | Freq: Every day | ORAL | 2 refills | Status: DC

## 2019-05-09 ENCOUNTER — Other Ambulatory Visit: Payer: Self-pay | Admitting: Family Medicine

## 2019-06-04 DIAGNOSIS — R69 Illness, unspecified: Secondary | ICD-10-CM | POA: Diagnosis not present

## 2019-06-14 ENCOUNTER — Other Ambulatory Visit: Payer: Self-pay

## 2019-06-17 ENCOUNTER — Ambulatory Visit (INDEPENDENT_AMBULATORY_CARE_PROVIDER_SITE_OTHER): Payer: Medicare HMO | Admitting: Family Medicine

## 2019-06-17 ENCOUNTER — Encounter: Payer: Self-pay | Admitting: Family Medicine

## 2019-06-17 ENCOUNTER — Other Ambulatory Visit: Payer: Self-pay

## 2019-06-17 VITALS — BP 108/70 | HR 66 | Temp 98.6°F | Resp 12 | Ht 71.0 in | Wt 176.0 lb

## 2019-06-17 DIAGNOSIS — I10 Essential (primary) hypertension: Secondary | ICD-10-CM

## 2019-06-17 DIAGNOSIS — E785 Hyperlipidemia, unspecified: Secondary | ICD-10-CM | POA: Diagnosis not present

## 2019-06-17 DIAGNOSIS — Z125 Encounter for screening for malignant neoplasm of prostate: Secondary | ICD-10-CM

## 2019-06-17 DIAGNOSIS — E119 Type 2 diabetes mellitus without complications: Secondary | ICD-10-CM

## 2019-06-17 NOTE — Progress Notes (Signed)
Subjective:    Patient ID: Aaron Mcfarland, male    DOB: [birth year], 78 y.o.   MRN: [id number]  HPI Patient is here today for regular checkup.  He denies any medical concerns.  His blood pressure today is outstanding at 108/70.  He denies any chest pain, shortness of breath, dyspnea on exertion.  He is currently on metformin 500 mg twice daily for diabetes.  He denies any neuropathy in his feet.  He denies any polyuria, polydipsia, or blurry vision.  His diet actually sounds very good except for perhaps eating too much bread and potatoes.  Otherwise he is avoiding soda and sweets.  He denies any myalgias or right upper quadrant pain.  He is taking an aspirin.  His flu shot is up-to-date.  His pneumonia vaccines are up-to-date.  Diabetic foot exam is performed today and is normal.  Depression, fall, and memory assessments are normal. Past Medical History:  Diagnosis Date  . Allergy   . Cataract    right eye with lens implant   . Diabetes mellitus without complication ([place])   . GERD (gastroesophageal reflux disease)    some  . Glaucoma    left eye  . Hyperlipidemia   . Hypertension    Past Surgical History:  Procedure Laterality Date  . CATARACT EXTRACTION Right    with lens implant 11-03-2014  . COLONOSCOPY  2004   Dr Collene Mares   . TONSILLECTOMY AND ADENOIDECTOMY     age 59   Current Outpatient Medications on File Prior to Visit  Medication Sig Dispense Refill  . aspirin 81 MG tablet Take 1 tablet (81 mg total) by mouth daily. 30 tablet 11  . atorvastatin (LIPITOR) 10 MG tablet TAKE 1 TABLET BY MOUTH ONCE DAILY WITH BREAKFAST 90 tablet 1  . benazepril (LOTENSIN) 10 MG tablet Take 1 tablet (10 mg total) by mouth daily. 90 tablet 2  . Blood Glucose Monitoring Suppl (ONE TOUCH ULTRA SYSTEM KIT) W/DEVICE KIT 1 kit by Does not apply route once. 1 each 0  . Fluticasone Propionate (FLONASE ALLERGY RELIEF NA) Place 2 sprays into the nose daily. Buying OTC    . glucose blood (ONE TOUCH ULTRA  TEST) test strip CHECK BLOOD SUGAR EACH DAY FASTING 50 each 2  . latanoprost (XALATAN) 0.005 % ophthalmic solution Place 1 drop into the left eye at bedtime.    . metFORMIN (GLUCOPHAGE) 500 MG tablet Take 1 tablet by mouth twice daily 180 tablet 0  . Multiple Vitamins-Minerals (MULTIVITAMIN PO) Take by mouth.    Glory Rosebush DELICA LANCETS FINE MISC CHECK BLOOD SUGAR ONCE DAILY FASTING 100 each 2  . triamterene-hydrochlorothiazide (MAXZIDE-25) 37.5-25 MG tablet Take 1 tablet by mouth once daily 90 tablet 0   Current Facility-Administered Medications on File Prior to Visit  Medication Dose Route Frequency Provider Last Rate Last Dose  . 0.9 %  sodium chloride infusion  500 mL Intravenous Continuous Pyrtle, Lajuan Lines, MD       No Known Allergies Social History   Socioeconomic History  . Marital status: Married    Spouse name: Not on file  . Number of children: Not on file  . Years of education: Not on file  . Highest education level: Not on file  Occupational History  . Not on file  Social Needs  . Financial resource strain: Not on file  . Food insecurity    Worry: Not on file    Inability: Not on file  . Transportation needs  Medical: Not on file    Non-medical: Not on file  Tobacco Use  . Smoking status: Former Smoker    Quit date: 04/24/1966    Years since quitting: 53.1  . Smokeless tobacco: Never Used  Substance and Sexual Activity  . Alcohol use: Yes  . Drug use: No  . Sexual activity: Yes    Birth control/protection: None  Lifestyle  . Physical activity    Days per week: Not on file    Minutes per session: Not on file  . Stress: Not on file  Relationships  . Social Herbalist on phone: Not on file    Gets together: Not on file    Attends religious service: Not on file    Active member of club or organization: Not on file    Attends meetings of clubs or organizations: Not on file    Relationship status: Not on file  . Intimate partner violence    Fear of  current or ex partner: Not on file    Emotionally abused: Not on file    Physically abused: Not on file    Forced sexual activity: Not on file  Other Topics Concern  . Not on file  Social History Narrative  . Not on file      Review of Systems  All other systems reviewed and are negative.      Objective:   Physical Exam Vitals signs reviewed.  Constitutional:      General: He is not in acute distress.    Appearance: Normal appearance. He is normal weight. He is not ill-appearing.  Neck:     Vascular: No carotid bruit.  Cardiovascular:     Rate and Rhythm: Normal rate and regular rhythm.     Pulses: Normal pulses.     Heart sounds: Normal heart sounds. No murmur. No friction rub. No gallop.   Pulmonary:     Effort: Pulmonary effort is normal. No respiratory distress.     Breath sounds: Normal breath sounds. No stridor. No wheezing, rhonchi or rales.  Chest:     Chest wall: No tenderness.  Abdominal:     General: Bowel sounds are normal. There is no distension.     Palpations: Abdomen is soft. There is no mass.     Tenderness: There is no abdominal tenderness. There is no right CVA tenderness, left CVA tenderness, guarding or rebound.     Hernia: No hernia is present.  Musculoskeletal:     Right lower leg: No edema.     Left lower leg: No edema.  Lymphadenopathy:     Cervical: No cervical adenopathy.  Neurological:     Mental Status: He is alert.           Assessment & Plan:  Diabetes mellitus without complication ([place]) - Plan: Hemoglobin A1c, CBC with Differential/Platelet, COMPLETE METABOLIC PANEL WITH GFR, Microalbumin, urine, Lipid panel  Essential hypertension  Hyperlipidemia, unspecified hyperlipidemia type  Prostate cancer screening - Plan: PSA Diabetic foot exam today is normal.  Recommended ophthalmology exam however he sees an ophthalmologist regularly for his glaucoma.  His blood pressure today is well controlled.  I will check a fasting lipid  panel.  Goal LDL cholesterol is less than 70.  I will check a urine microalbumin.  I will check a hemoglobin A1c.  Goal hemoglobin A1c is less than 6.5.  Patient's already taking an aspirin 81 mg daily.  We discussed the risk and benefit ratio for prostate cancer  screening and given how healthy and vigorous the patient is a 8 I recommended a PSA to screen for prostate cancer.

## 2019-06-18 LAB — COMPLETE METABOLIC PANEL WITH GFR
AG Ratio: 1.6 (calc) (ref 1.0–2.5)
ALT: 23 U/L (ref 9–46)
AST: 18 U/L (ref 10–35)
Albumin: 4.7 g/dL (ref 3.6–5.1)
Alkaline phosphatase (APISO): 74 U/L (ref 35–144)
BUN: 17 mg/dL (ref 7–25)
CO2: 23 mmol/L (ref 20–32)
Calcium: 10.1 mg/dL (ref 8.6–10.3)
Chloride: 106 mmol/L (ref 98–110)
Creat: 1.1 mg/dL (ref 0.70–1.18)
GFR, Est African American: 74 mL/min/{1.73_m2} (ref >=60)
GFR, Est Non African American: 64 mL/min/{1.73_m2} (ref >=60)
Globulin: 2.9 g/dL (calc) (ref 1.9–3.7)
Glucose, Bld: 124 mg/dL — ABNORMAL HIGH (ref 65–99)
Potassium: 4.2 mmol/L (ref 3.5–5.3)
Sodium: 140 mmol/L (ref 135–146)
Total Bilirubin: 0.9 mg/dL (ref 0.2–1.2)
Total Protein: 7.6 g/dL (ref 6.1–8.1)

## 2019-06-18 LAB — CBC WITH DIFFERENTIAL/PLATELET
Absolute Monocytes: 426 cells/uL (ref 200–950)
Basophils Absolute: 28 cells/uL (ref 0–200)
Basophils Relative: 0.5 %
Eosinophils Absolute: 67 cells/uL (ref 15–500)
Eosinophils Relative: 1.2 %
HCT: 40.3 % (ref 38.5–50.0)
Hemoglobin: 13.9 g/dL (ref 13.2–17.1)
Lymphs Abs: 1736 cells/uL (ref 850–3900)
MCH: 32.9 pg (ref 27.0–33.0)
MCHC: 34.5 g/dL (ref 32.0–36.0)
MCV: 95.5 fL (ref 80.0–100.0)
MPV: 10 fL (ref 7.5–12.5)
Monocytes Relative: 7.6 %
Neutro Abs: 3343 cells/uL (ref 1500–7800)
Neutrophils Relative %: 59.7 %
Platelets: 314 10*3/uL (ref 140–400)
RBC: 4.22 10*6/uL (ref 4.20–5.80)
RDW: 13.1 % (ref 11.0–15.0)
Total Lymphocyte: 31 %
WBC: 5.6 10*3/uL (ref 3.8–10.8)

## 2019-06-18 LAB — MICROALBUMIN, URINE: Microalb, Ur: 0.4 mg/dL

## 2019-06-18 LAB — LIPID PANEL
Cholesterol: 130 mg/dL (ref <200)
HDL: 62 mg/dL (ref >=40)
LDL Cholesterol (Calc): 50 mg/dL (calc)
Non-HDL Cholesterol (Calc): 68 mg/dL (calc) (ref <130)
Total CHOL/HDL Ratio: 2.1 (calc) (ref <5.0)
Triglycerides: 95 mg/dL (ref <150)

## 2019-06-18 LAB — HEMOGLOBIN A1C
Hgb A1c MFr Bld: 6.4 % of total Hgb — ABNORMAL HIGH (ref <5.7)
Mean Plasma Glucose: 137 (calc)
eAG (mmol/L): 7.6 (calc)

## 2019-06-18 LAB — PSA: PSA: 1.8 ng/mL (ref <=4.0)

## 2019-06-25 ENCOUNTER — Other Ambulatory Visit: Payer: Self-pay | Admitting: Family Medicine

## 2019-09-09 ENCOUNTER — Other Ambulatory Visit: Payer: Self-pay | Admitting: Family Medicine

## 2019-10-14 ENCOUNTER — Other Ambulatory Visit: Payer: Self-pay | Admitting: Family Medicine

## 2019-10-25 ENCOUNTER — Telehealth: Payer: Self-pay | Admitting: Family Medicine

## 2019-10-25 DIAGNOSIS — R69 Illness, unspecified: Secondary | ICD-10-CM | POA: Diagnosis not present

## 2019-10-25 MED ORDER — GLUCOSE BLOOD VI STRP: ORAL_STRIP | 2 refills | Status: DC

## 2019-10-25 MED ORDER — ONETOUCH DELICA LANCETS 33G MISC: 3 refills | Status: DC

## 2019-10-25 NOTE — Telephone Encounter (Signed)
Lancets and test strips ordered

## 2019-10-26 DIAGNOSIS — R69 Illness, unspecified: Secondary | ICD-10-CM | POA: Diagnosis not present

## 2019-12-24 ENCOUNTER — Other Ambulatory Visit: Payer: Self-pay | Admitting: Family Medicine

## 2020-01-25 DIAGNOSIS — R69 Illness, unspecified: Secondary | ICD-10-CM | POA: Diagnosis not present

## 2020-01-27 ENCOUNTER — Telehealth: Payer: Self-pay | Admitting: Family Medicine

## 2020-01-28 DIAGNOSIS — H401131 Primary open-angle glaucoma, bilateral, mild stage: Secondary | ICD-10-CM | POA: Diagnosis not present

## 2020-01-28 DIAGNOSIS — H2512 Age-related nuclear cataract, left eye: Secondary | ICD-10-CM | POA: Diagnosis not present

## 2020-01-28 NOTE — Telephone Encounter (Signed)
error 

## 2020-01-30 ENCOUNTER — Encounter: Payer: Self-pay | Admitting: Family Medicine

## 2020-01-30 ENCOUNTER — Other Ambulatory Visit: Payer: Self-pay

## 2020-01-30 ENCOUNTER — Ambulatory Visit (INDEPENDENT_AMBULATORY_CARE_PROVIDER_SITE_OTHER): Payer: Medicare HMO | Admitting: Family Medicine

## 2020-01-30 VITALS — BP 136/72 | HR 98 | Temp 98.1°F | Resp 14 | Ht 71.0 in | Wt 180.0 lb

## 2020-01-30 DIAGNOSIS — Z136 Encounter for screening for cardiovascular disorders: Secondary | ICD-10-CM | POA: Diagnosis not present

## 2020-01-30 DIAGNOSIS — D649 Anemia, unspecified: Secondary | ICD-10-CM | POA: Diagnosis not present

## 2020-01-30 DIAGNOSIS — E119 Type 2 diabetes mellitus without complications: Secondary | ICD-10-CM | POA: Diagnosis not present

## 2020-01-30 DIAGNOSIS — I10 Essential (primary) hypertension: Secondary | ICD-10-CM | POA: Diagnosis not present

## 2020-01-30 DIAGNOSIS — E785 Hyperlipidemia, unspecified: Secondary | ICD-10-CM | POA: Diagnosis not present

## 2020-01-30 DIAGNOSIS — R6889 Other general symptoms and signs: Secondary | ICD-10-CM | POA: Diagnosis not present

## 2020-01-30 NOTE — Progress Notes (Signed)
Subjective:    Patient ID: Aaron Mcfarland, male    DOB: [birth year], 79 y.o.   MRN: [id number]  Medication Refill   Patient is here today for regular checkup.  He denies any medical concerns.  His blood pressure today is outstanding at 136/72.  He denies any chest pain, shortness of breath, dyspnea on exertion.  He is currently on metformin 500 mg twice daily for diabetes.  He denies any neuropathy in his feet.  He denies any polyuria, polydipsia, or blurry vision.  He seldom experiences low blood sugar however this tends to occur when he has skipped breakfast.  It does not happen often.  He states that it happens approximately once a month.  He is already had his second Covid vaccine.  He did smoke when he was in his 70s however he is quit more than 50 years ago.  However he has never been screened for a AAA.  He denies any abdominal pain.  He denies any myalgias or right upper quadrant pain.  He is taking an aspirin.  His flu shot is up-to-date.  His pneumonia vaccines are up-to-date.  Diabetic foot exam is performed today and is normal.  Depression, fall, and memory assessments are normal. Past Medical History:  Diagnosis Date  . Allergy   . Cataract    right eye with lens implant   . Diabetes mellitus without complication ([place])   . GERD (gastroesophageal reflux disease)    some  . Glaucoma    left eye  . Hyperlipidemia   . Hypertension    Past Surgical History:  Procedure Laterality Date  . CATARACT EXTRACTION Right    with lens implant 11-03-2014  . COLONOSCOPY  2004   Dr Collene Mares   . TONSILLECTOMY AND ADENOIDECTOMY     age 67   Current Outpatient Medications on File Prior to Visit  Medication Sig Dispense Refill  . aspirin 81 MG tablet Take 1 tablet (81 mg total) by mouth daily. 30 tablet 11  . atorvastatin (LIPITOR) 10 MG tablet Take 1 tablet by mouth once daily with breakfast 90 tablet 0  . benazepril (LOTENSIN) 10 MG tablet Take 1 tablet (10 mg total) by mouth daily. 90  tablet 2  . Blood Glucose Monitoring Suppl (ONE TOUCH ULTRA SYSTEM KIT) W/DEVICE KIT 1 kit by Does not apply route once. 1 each 0  . Fluticasone Propionate (FLONASE ALLERGY RELIEF NA) Place 2 sprays into the nose daily. Buying OTC    . glucose blood (ONE TOUCH ULTRA TEST) test strip CHECK BLOOD SUGAR EACH DAY FASTING 100 each 2  . latanoprost (XALATAN) 0.005 % ophthalmic solution Place 1 drop into the left eye at bedtime.    . metFORMIN (GLUCOPHAGE) 500 MG tablet Take 1 tablet by mouth twice daily 180 tablet 0  . Multiple Vitamins-Minerals (MULTIVITAMIN PO) Take by mouth.    [name] Check FBS daily 100 each 3  . triamterene-hydrochlorothiazide (MAXZIDE-25) 37.5-25 MG tablet Take 1 tablet by mouth once daily 90 tablet 2   Current Facility-Administered Medications on File Prior to Visit  Medication Dose Route Frequency Provider Last Rate Last Admin  . 0.9 %  sodium chloride infusion  500 mL Intravenous Continuous Pyrtle, Lajuan Lines, MD       No Known Allergies Social History   Socioeconomic History  . Marital status: Married    Spouse name: Not on file  . Number of children: Not on file  . Years of  education: Not on file  . Highest education level: Not on file  Occupational History  . Not on file  Tobacco Use  . Smoking status: Former Smoker    Quit date: 04/24/1966    Years since quitting: 53.8  . Smokeless tobacco: Never Used  Substance and Sexual Activity  . Alcohol use: Yes  . Drug use: No  . Sexual activity: Yes    Birth control/protection: None  Other Topics Concern  . Not on file  Social History Narrative  . Not on file   Social Determinants of Health   Financial Resource Strain:   . Difficulty of Paying Living Expenses:   Food Insecurity:   . Worried About Charity fundraiser in the Last Year:   . Arboriculturist in the Last Year:   Transportation Needs:   . Film/video editor (Medical):   Marland Kitchen Lack of Transportation (Non-Medical):    Physical Activity:   . Days of Exercise per Week:   . Minutes of Exercise per Session:   Stress:   . Feeling of Stress :   Social Connections:   . Frequency of Communication with Friends and Family:   . Frequency of Social Gatherings with Friends and Family:   . Attends Religious Services:   . Active Member of Clubs or Organizations:   . Attends Archivist Meetings:   Marland Kitchen Marital Status:   Intimate Partner Violence:   . Fear of Current or Ex-Partner:   . Emotionally Abused:   Marland Kitchen Physically Abused:   . Sexually Abused:       Review of Systems  All other systems reviewed and are negative.      Objective:   Physical Exam Vitals reviewed.  Constitutional:      General: He is not in acute distress.    Appearance: Normal appearance. He is normal weight. He is not ill-appearing.  Neck:     Vascular: No carotid bruit.  Cardiovascular:     Rate and Rhythm: Normal rate and regular rhythm.     Pulses: Normal pulses.     Heart sounds: Normal heart sounds. No murmur. No friction rub. No gallop.   Pulmonary:     Effort: Pulmonary effort is normal. No respiratory distress.     Breath sounds: Normal breath sounds. No stridor. No wheezing, rhonchi or rales.  Chest:     Chest wall: No tenderness.  Abdominal:     General: Bowel sounds are normal. There is no distension.     Palpations: Abdomen is soft. There is no mass.     Tenderness: There is no abdominal tenderness. There is no right CVA tenderness, left CVA tenderness, guarding or rebound.     Hernia: No hernia is present.  Musculoskeletal:     Right lower leg: No edema.     Left lower leg: No edema.  Lymphadenopathy:     Cervical: No cervical adenopathy.  Neurological:     Mental Status: He is alert.           Assessment & Plan:  Diabetes mellitus without complication ([place]) - Plan: Hemoglobin A1c, CBC with Differential/Platelet, COMPLETE METABOLIC PANEL WITH GFR, Lipid panel, Microalbumin, urine  Essential  hypertension  Hyperlipidemia, unspecified hyperlipidemia type  Encounter for abdominal aortic aneurysm (AAA) screening - Plan: VAS [place] AAA DUPLEX  Patient's blood pressure today is outstanding.  His immunizations are up-to-date except for shingles vaccine.  I did encourage him to get the shingles vaccine at his convenience.  Given his history of smoking and his age, I would recommend screening for AAA.  I will schedule this as an outpatient.  I will check a hemoglobin A1c.  Goal hemoglobin A1c would be less than 7.  I would also like to check a fasting lipid panel.  Goal LDL cholesterol is less than 100.  Screen for diabetic nephropathy by checking a urine microalbumin.

## 2020-02-01 LAB — HEMOGLOBIN A1C
Hgb A1c MFr Bld: 6.7 % of total Hgb — ABNORMAL HIGH (ref <5.7)
Mean Plasma Glucose: 146 (calc)
eAG (mmol/L): 8.1 (calc)

## 2020-02-01 LAB — COMPLETE METABOLIC PANEL WITH GFR
AG Ratio: 1.7 (calc) (ref 1.0–2.5)
ALT: 16 U/L (ref 9–46)
AST: 15 U/L (ref 10–35)
Albumin: 4.5 g/dL (ref 3.6–5.1)
Alkaline phosphatase (APISO): 91 U/L (ref 35–144)
BUN/Creatinine Ratio: 22 (calc) (ref 6–22)
BUN: 28 mg/dL — ABNORMAL HIGH (ref 7–25)
CO2: 23 mmol/L (ref 20–32)
Calcium: 10 mg/dL (ref 8.6–10.3)
Chloride: 105 mmol/L (ref 98–110)
Creat: 1.29 mg/dL — ABNORMAL HIGH (ref 0.70–1.18)
GFR, Est African American: 61 mL/min/{1.73_m2} (ref >=60)
GFR, Est Non African American: 52 mL/min/{1.73_m2} — ABNORMAL LOW (ref >=60)
Globulin: 2.7 g/dL (calc) (ref 1.9–3.7)
Glucose, Bld: 153 mg/dL — ABNORMAL HIGH (ref 65–99)
Potassium: 4.4 mmol/L (ref 3.5–5.3)
Sodium: 140 mmol/L (ref 135–146)
Total Bilirubin: 0.8 mg/dL (ref 0.2–1.2)
Total Protein: 7.2 g/dL (ref 6.1–8.1)

## 2020-02-01 LAB — MICROALBUMIN, URINE: Microalb, Ur: 0.8 mg/dL

## 2020-02-01 LAB — CBC WITH DIFFERENTIAL/PLATELET
Absolute Monocytes: 638 cells/uL (ref 200–950)
Basophils Absolute: 28 cells/uL (ref 0–200)
Basophils Relative: 0.5 %
Eosinophils Absolute: 230 cells/uL (ref 15–500)
Eosinophils Relative: 4.1 %
HCT: 36.9 % — ABNORMAL LOW (ref 38.5–50.0)
Hemoglobin: 12.8 g/dL — ABNORMAL LOW (ref 13.2–17.1)
Lymphs Abs: 2083 cells/uL (ref 850–3900)
MCH: 33.8 pg — ABNORMAL HIGH (ref 27.0–33.0)
MCHC: 34.7 g/dL (ref 32.0–36.0)
MCV: 97.4 fL (ref 80.0–100.0)
MPV: 9.7 fL (ref 7.5–12.5)
Monocytes Relative: 11.4 %
Neutro Abs: 2621 cells/uL (ref 1500–7800)
Neutrophils Relative %: 46.8 %
Platelets: 297 10*3/uL (ref 140–400)
RBC: 3.79 10*6/uL — ABNORMAL LOW (ref 4.20–5.80)
RDW: 12.7 % (ref 11.0–15.0)
Total Lymphocyte: 37.2 %
WBC: 5.6 10*3/uL (ref 3.8–10.8)

## 2020-02-01 LAB — IRON: Iron: 103 ug/dL (ref 50–180)

## 2020-02-01 LAB — TEST AUTHORIZATION

## 2020-02-01 LAB — LIPID PANEL
Cholesterol: 159 mg/dL (ref <200)
HDL: 57 mg/dL (ref >=40)
LDL Cholesterol (Calc): 85 mg/dL (calc)
Non-HDL Cholesterol (Calc): 102 mg/dL (calc) (ref <130)
Total CHOL/HDL Ratio: 2.8 (calc) (ref <5.0)
Triglycerides: 80 mg/dL (ref <150)

## 2020-02-01 LAB — VITAMIN B12: Vitamin B-12: >2000 pg/mL — ABNORMAL HIGH (ref 200–1100)

## 2020-02-07 ENCOUNTER — Other Ambulatory Visit: Payer: Self-pay

## 2020-02-07 ENCOUNTER — Other Ambulatory Visit: Payer: Medicare HMO

## 2020-02-07 DIAGNOSIS — D649 Anemia, unspecified: Secondary | ICD-10-CM

## 2020-02-08 LAB — FECAL GLOBIN BY IMMUNOCHEMISTRY
FECAL GLOBIN RESULT:: NOT DETECTED
MICRO NUMBER:: 10452704
SPECIMEN QUALITY:: ADEQUATE

## 2020-02-12 ENCOUNTER — Encounter: Payer: Self-pay | Admitting: Family Medicine

## 2020-02-12 ENCOUNTER — Ambulatory Visit (HOSPITAL_COMMUNITY)
Admission: RE | Admit: 2020-02-12 | Discharge: 2020-02-12 | Disposition: A | Discharge status: Home / Self Care | Payer: Medicare HMO | Source: Ambulatory Visit | Attending: Family Medicine | Admitting: Family Medicine

## 2020-02-12 ENCOUNTER — Other Ambulatory Visit: Payer: Self-pay

## 2020-02-12 DIAGNOSIS — Z136 Encounter for screening for cardiovascular disorders: Secondary | ICD-10-CM | POA: Diagnosis not present

## 2020-02-13 ENCOUNTER — Encounter: Payer: Self-pay | Admitting: Family Medicine

## 2020-02-13 DIAGNOSIS — I714 Abdominal aortic aneurysm, without rupture, unspecified: Secondary | ICD-10-CM | POA: Insufficient documentation

## 2020-03-04 ENCOUNTER — Other Ambulatory Visit: Payer: Self-pay | Admitting: Family Medicine

## 2020-03-25 ENCOUNTER — Other Ambulatory Visit: Payer: Self-pay | Admitting: Family Medicine

## 2020-04-25 DIAGNOSIS — R69 Illness, unspecified: Secondary | ICD-10-CM | POA: Diagnosis not present

## 2020-04-28 DIAGNOSIS — H401131 Primary open-angle glaucoma, bilateral, mild stage: Secondary | ICD-10-CM | POA: Diagnosis not present

## 2020-04-28 DIAGNOSIS — H2512 Age-related nuclear cataract, left eye: Secondary | ICD-10-CM | POA: Diagnosis not present

## 2020-05-08 ENCOUNTER — Other Ambulatory Visit: Payer: Self-pay | Admitting: Family Medicine

## 2020-05-08 DIAGNOSIS — I10 Essential (primary) hypertension: Secondary | ICD-10-CM

## 2020-06-04 DIAGNOSIS — R69 Illness, unspecified: Secondary | ICD-10-CM | POA: Diagnosis not present

## 2020-06-29 DIAGNOSIS — H268 Other specified cataract: Secondary | ICD-10-CM | POA: Diagnosis not present

## 2020-06-29 DIAGNOSIS — H2512 Age-related nuclear cataract, left eye: Secondary | ICD-10-CM | POA: Diagnosis not present

## 2020-07-14 DIAGNOSIS — H401131 Primary open-angle glaucoma, bilateral, mild stage: Secondary | ICD-10-CM | POA: Diagnosis not present

## 2020-07-14 DIAGNOSIS — Z961 Presence of intraocular lens: Secondary | ICD-10-CM | POA: Diagnosis not present

## 2020-07-21 DIAGNOSIS — H401131 Primary open-angle glaucoma, bilateral, mild stage: Secondary | ICD-10-CM | POA: Diagnosis not present

## 2020-07-21 DIAGNOSIS — Z961 Presence of intraocular lens: Secondary | ICD-10-CM | POA: Diagnosis not present

## 2020-07-23 ENCOUNTER — Other Ambulatory Visit: Payer: Self-pay | Admitting: Family Medicine

## 2020-07-25 DIAGNOSIS — R69 Illness, unspecified: Secondary | ICD-10-CM | POA: Diagnosis not present

## 2020-08-11 ENCOUNTER — Other Ambulatory Visit: Payer: Self-pay | Admitting: Family Medicine

## 2020-08-11 DIAGNOSIS — I10 Essential (primary) hypertension: Secondary | ICD-10-CM

## 2020-09-01 DIAGNOSIS — Z961 Presence of intraocular lens: Secondary | ICD-10-CM | POA: Diagnosis not present

## 2020-09-01 DIAGNOSIS — H401131 Primary open-angle glaucoma, bilateral, mild stage: Secondary | ICD-10-CM | POA: Diagnosis not present

## 2020-09-09 ENCOUNTER — Other Ambulatory Visit: Payer: Self-pay | Admitting: Family Medicine

## 2020-09-15 ENCOUNTER — Other Ambulatory Visit: Payer: Self-pay | Admitting: Family Medicine

## 2020-09-21 ENCOUNTER — Other Ambulatory Visit: Payer: Self-pay

## 2020-09-21 ENCOUNTER — Ambulatory Visit (INDEPENDENT_AMBULATORY_CARE_PROVIDER_SITE_OTHER): Payer: Medicare HMO | Admitting: Family Medicine

## 2020-09-21 VITALS — BP 122/80 | HR 83 | Temp 97.4°F | Ht 71.0 in | Wt 170.0 lb

## 2020-09-21 DIAGNOSIS — I10 Essential (primary) hypertension: Secondary | ICD-10-CM

## 2020-09-21 DIAGNOSIS — E785 Hyperlipidemia, unspecified: Secondary | ICD-10-CM

## 2020-09-21 DIAGNOSIS — E119 Type 2 diabetes mellitus without complications: Secondary | ICD-10-CM

## 2020-09-21 NOTE — Progress Notes (Signed)
 Subjective:    Patient ID: Aaron Mcfarland, male    DOB: 12/17/1940, 79 y.o.   MRN: [id number]  Medication Refill  Patient is a very pleasant 79-year-old African-American gentleman who presents today for a checkup.  He had cataract surgery in September and has done well.  He denies any blurry vision.  He denies any pain.  He had his last eye exam in November and there was no evidence of diabetic retinopathy.  His blood pressure today is outstanding.  He denies any chest pain shortness of breath or dyspnea on exertion.  He denies any myalgias or right upper quadrant pain.  He is on benazepril for renal protection.  He is on atorvastatin.  He is also on Metformin.  He denies any diarrhea.  His last A1c was 6.7 in April.  He is due to recheck that now.  Has had his flu shot.  Has had his Covid vaccine.  He is even had his first dose of the shingles vaccine. Past Medical History:  Diagnosis Date  . AAA (abdominal aortic aneurysm) (HCC)    3.2 cm 2021  . Allergy   . Cataract    right eye with lens implant   . Diabetes mellitus without complication (HCC)   . GERD (gastroesophageal reflux disease)    some  . Glaucoma    left eye  . Hyperlipidemia   . Hypertension    Past Surgical History:  Procedure Laterality Date  . CATARACT EXTRACTION Right    with lens implant 11-03-2014  . COLONOSCOPY  2004   Dr Mann   . TONSILLECTOMY AND ADENOIDECTOMY     age 11   Current Outpatient Medications on File Prior to Visit  Medication Sig Dispense Refill  . aspirin 81 MG tablet Take 1 tablet (81 mg total) by mouth daily. 30 tablet 11  . atorvastatin (LIPITOR) 10 MG tablet Take 1 tablet by mouth once daily with breakfast 90 tablet 0  . benazepril (LOTENSIN) 10 MG tablet Take 1 tablet by mouth once daily 90 tablet 3  . Blood Glucose Monitoring Suppl (ONE TOUCH ULTRA SYSTEM KIT) W/DEVICE KIT 1 kit by Does not apply route once. 1 each 0  . Fluticasone Propionate (FLONASE ALLERGY RELIEF NA) Place 2  sprays into the nose daily. Buying OTC    . glucose blood (ONE TOUCH ULTRA TEST) test strip CHECK BLOOD SUGAR EACH DAY FASTING 100 each 2  . latanoprost (XALATAN) 0.005 % ophthalmic solution Place 1 drop into the left eye at bedtime.    . metFORMIN (GLUCOPHAGE) 500 MG tablet Take 1 tablet by mouth twice daily 180 tablet 0  . Multiple Vitamins-Minerals (MULTIVITAMIN PO) Take by mouth.    . OneTouch Delica Lancets 33G MISC Check FBS daily 100 each 3  . triamterene-hydrochlorothiazide (MAXZIDE-25) 37.5-25 MG tablet Take 1 tablet by mouth once daily 90 tablet 0   Current Facility-Administered Medications on File Prior to Visit  Medication Dose Route Frequency Provider Last Rate Last Admin  . 0.9 %  sodium chloride infusion  500 mL Intravenous Continuous Pyrtle, Jay M, MD       No Known Allergies Social History   Socioeconomic History  . Marital status: Married    Spouse name: Not on file  . Number of children: Not on file  . Years of education: Not on file  . Highest education level: Not on file  Occupational History  . Not on file  Tobacco Use  . Smoking status: Former Smoker      Quit date: 04/24/1966    Years since quitting: 54.4  . Smokeless tobacco: Never Used  Substance and Sexual Activity  . Alcohol use: Yes  . Drug use: No  . Sexual activity: Yes    Birth control/protection: None  Other Topics Concern  . Not on file  Social History Narrative  . Not on file   Social Determinants of Health   Financial Resource Strain: Not on file  Food Insecurity: Not on file  Transportation Needs: Not on file  Physical Activity: Not on file  Stress: Not on file  Social Connections: Not on file  Intimate Partner Violence: Not on file      Review of Systems  All other systems reviewed and are negative.      Objective:   Physical Exam Vitals reviewed.  Constitutional:      General: He is not in acute distress.    Appearance: Normal appearance. He is normal weight. He is not  ill-appearing.  Neck:     Vascular: No carotid bruit.  Cardiovascular:     Rate and Rhythm: Normal rate and regular rhythm.     Pulses: Normal pulses.     Heart sounds: Normal heart sounds. No murmur heard. No friction rub. No gallop.   Pulmonary:     Effort: Pulmonary effort is normal. No respiratory distress.     Breath sounds: Normal breath sounds. No stridor. No wheezing, rhonchi or rales.  Chest:     Chest wall: No tenderness.  Abdominal:     General: Bowel sounds are normal. There is no distension.     Palpations: Abdomen is soft. There is no mass.     Tenderness: There is no abdominal tenderness. There is no right CVA tenderness, left CVA tenderness, guarding or rebound.     Hernia: No hernia is present.  Musculoskeletal:     Right lower leg: No edema.     Left lower leg: No edema.  Lymphadenopathy:     Cervical: No cervical adenopathy.  Neurological:     Mental Status: He is alert.           Assessment & Plan:  Diabetes mellitus without complication ([place]) - Plan: Hemoglobin A1c, CBC with Differential/Platelet, COMPLETE METABOLIC PANEL WITH GFR, Lipid panel, Microalbumin, urine  Essential hypertension  Hyperlipidemia, unspecified hyperlipidemia type  Respectfully, the patient does not appear 79 years old.  He looks much younger.  He is in great physical shape.  His immunizations are up-to-date.  His blood pressure is excellent.  I will check an A1c, CBC, CMP, lipid panel, and urine microalbumin.  I would like to see his A1c below 7, his LDL cholesterol below 100, and his urine microalbumin less than 30.  Diabetic foot exam and diabetic eye exam are up-to-date

## 2020-09-22 LAB — LIPID PANEL
Cholesterol: 171 mg/dL (ref <200)
HDL: 65 mg/dL (ref >=40)
LDL Cholesterol (Calc): 85 mg/dL (calc)
Non-HDL Cholesterol (Calc): 106 mg/dL (calc) (ref <130)
Total CHOL/HDL Ratio: 2.6 (calc) (ref <5.0)
Triglycerides: 112 mg/dL (ref <150)

## 2020-09-22 LAB — COMPLETE METABOLIC PANEL WITHOUT GFR
AG Ratio: 1.6 (calc) (ref 1.0–2.5)
ALT: 23 U/L (ref 9–46)
AST: 17 U/L (ref 10–35)
Albumin: 4.7 g/dL (ref 3.6–5.1)
Alkaline phosphatase (APISO): 70 U/L (ref 35–144)
BUN/Creatinine Ratio: 13 (calc) (ref 6–22)
BUN: 17 mg/dL (ref 7–25)
CO2: 25 mmol/L (ref 20–32)
Calcium: 10.1 mg/dL (ref 8.6–10.3)
Chloride: 102 mmol/L (ref 98–110)
Creat: 1.31 mg/dL — ABNORMAL HIGH (ref 0.70–1.11)
GFR, Est African American: 59 mL/min/1.73m2 — ABNORMAL LOW
GFR, Est Non African American: 51 mL/min/1.73m2 — ABNORMAL LOW
Globulin: 2.9 g/dL (ref 1.9–3.7)
Glucose, Bld: 124 mg/dL — ABNORMAL HIGH (ref 65–99)
Potassium: 4.3 mmol/L (ref 3.5–5.3)
Sodium: 138 mmol/L (ref 135–146)
Total Bilirubin: 0.7 mg/dL (ref 0.2–1.2)
Total Protein: 7.6 g/dL (ref 6.1–8.1)

## 2020-09-22 LAB — CBC WITH DIFFERENTIAL/PLATELET
Absolute Monocytes: 542 {cells}/uL (ref 200–950)
Basophils Absolute: 32 {cells}/uL (ref 0–200)
Basophils Relative: 0.5 %
Eosinophils Absolute: 101 {cells}/uL (ref 15–500)
Eosinophils Relative: 1.6 %
HCT: 39.2 % (ref 38.5–50.0)
Hemoglobin: 13.7 g/dL (ref 13.2–17.1)
Lymphs Abs: 2306 {cells}/uL (ref 850–3900)
MCH: 33.8 pg — ABNORMAL HIGH (ref 27.0–33.0)
MCHC: 34.9 g/dL (ref 32.0–36.0)
MCV: 96.8 fL (ref 80.0–100.0)
MPV: 9.5 fL (ref 7.5–12.5)
Monocytes Relative: 8.6 %
Neutro Abs: 3320 {cells}/uL (ref 1500–7800)
Neutrophils Relative %: 52.7 %
Platelets: 313 Thousand/uL (ref 140–400)
RBC: 4.05 Million/uL — ABNORMAL LOW (ref 4.20–5.80)
RDW: 12.4 % (ref 11.0–15.0)
Total Lymphocyte: 36.6 %
WBC: 6.3 Thousand/uL (ref 3.8–10.8)

## 2020-09-22 LAB — HEMOGLOBIN A1C
Hgb A1c MFr Bld: 6.2 % of total Hgb — ABNORMAL HIGH (ref <5.7)
Mean Plasma Glucose: 131 mg/dL
eAG (mmol/L): 7.3 mmol/L

## 2020-09-22 LAB — MICROALBUMIN, URINE: Microalb, Ur: 1 mg/dL

## 2020-10-26 ENCOUNTER — Other Ambulatory Visit: Payer: Self-pay | Admitting: Family Medicine

## 2020-11-12 ENCOUNTER — Other Ambulatory Visit: Payer: Self-pay | Admitting: Family Medicine

## 2020-11-12 DIAGNOSIS — I10 Essential (primary) hypertension: Secondary | ICD-10-CM

## 2020-12-03 DIAGNOSIS — Z961 Presence of intraocular lens: Secondary | ICD-10-CM | POA: Diagnosis not present

## 2020-12-03 DIAGNOSIS — H401131 Primary open-angle glaucoma, bilateral, mild stage: Secondary | ICD-10-CM | POA: Diagnosis not present

## 2020-12-04 DIAGNOSIS — H401131 Primary open-angle glaucoma, bilateral, mild stage: Secondary | ICD-10-CM | POA: Diagnosis not present

## 2020-12-06 ENCOUNTER — Other Ambulatory Visit: Payer: Self-pay | Admitting: Family Medicine

## 2020-12-07 DIAGNOSIS — H52209 Unspecified astigmatism, unspecified eye: Secondary | ICD-10-CM | POA: Diagnosis not present

## 2020-12-07 DIAGNOSIS — H5213 Myopia, bilateral: Secondary | ICD-10-CM | POA: Diagnosis not present

## 2020-12-07 DIAGNOSIS — H524 Presbyopia: Secondary | ICD-10-CM | POA: Diagnosis not present

## 2021-01-14 DIAGNOSIS — Z961 Presence of intraocular lens: Secondary | ICD-10-CM | POA: Diagnosis not present

## 2021-01-14 DIAGNOSIS — H401131 Primary open-angle glaucoma, bilateral, mild stage: Secondary | ICD-10-CM | POA: Diagnosis not present

## 2021-01-25 ENCOUNTER — Other Ambulatory Visit: Payer: Self-pay | Admitting: Family Medicine

## 2021-02-18 DIAGNOSIS — H401131 Primary open-angle glaucoma, bilateral, mild stage: Secondary | ICD-10-CM | POA: Diagnosis not present

## 2021-02-18 DIAGNOSIS — Z961 Presence of intraocular lens: Secondary | ICD-10-CM | POA: Diagnosis not present

## 2021-03-05 ENCOUNTER — Ambulatory Visit: Payer: Medicare HMO

## 2021-03-08 ENCOUNTER — Other Ambulatory Visit: Payer: Self-pay | Admitting: Family Medicine

## 2021-03-11 ENCOUNTER — Ambulatory Visit (INDEPENDENT_AMBULATORY_CARE_PROVIDER_SITE_OTHER): Payer: Medicare HMO

## 2021-03-11 ENCOUNTER — Other Ambulatory Visit: Payer: Self-pay

## 2021-03-11 VITALS — BP 120/60 | HR 82 | Temp 98.5°F | Ht 71.0 in | Wt 175.2 lb

## 2021-03-11 DIAGNOSIS — Z0000 Encounter for general adult medical examination without abnormal findings: Secondary | ICD-10-CM | POA: Diagnosis not present

## 2021-03-11 NOTE — Progress Notes (Signed)
Subjective:   Aaron Mcfarland is a 80 y.o. male who presents for Medicare Annual/Subsequent preventive examination.  Review of Systems    N/A  Cardiac Risk Factors include: advanced age (>26mn, >>23women);male gender;diabetes mellitus;hypertension;dyslipidemia     Objective:    Today's Vitals   03/11/21 1202  BP: 120/60  Pulse: 82  Temp: 98.5 F (36.9 C)  TempSrc: Oral  SpO2: 94%  Weight: 175 lb 4 oz (79.5 kg)  Height: 5' 11" (1.803 m)   Body mass index is 24.44 kg/m.  Advanced Directives 03/11/2021 07/18/2017 07/04/2017  Does Patient Have a Medical Advance Directive? No No No  Would patient like information on creating a medical advance directive? No - Patient declined - -    Current Medications (verified) Outpatient Encounter Medications as of 03/11/2021  Medication Sig   aspirin 81 MG tablet Take 1 tablet (81 mg total) by mouth daily.   atorvastatin (LIPITOR) 10 MG tablet Take 1 tablet by mouth once daily with breakfast   benazepril (LOTENSIN) 10 MG tablet Take 1 tablet by mouth once daily   Blood Glucose Monitoring Suppl (ONE TOUCH ULTRA SYSTEM KIT) W/DEVICE KIT 1 kit by Does not apply route once.   COMBIGAN 0.2-0.5 % ophthalmic solution Apply 1 drop to eye 2 (two) times daily.   Fluticasone Propionate (FLONASE ALLERGY RELIEF NA) Place 2 sprays into the nose daily. Buying OTC   Lancets (ONETOUCH DELICA PLUS LONGEXB28U MISC CHECK BLOOD SUGARS ONCE DAILY   latanoprost (XALATAN) 0.005 % ophthalmic solution Place 1 drop into the left eye at bedtime.   metFORMIN (GLUCOPHAGE) 500 MG tablet Take 1 tablet by mouth twice daily   Multiple Vitamins-Minerals (MULTIVITAMIN PO) Take by mouth.   ONETOUCH ULTRA test strip CHECK BLOOD SUGAR EACH DAY FASTING.   triamterene-hydrochlorothiazide (MAXZIDE-25) 37.5-25 MG tablet Take 1 tablet by mouth once daily   Facility-Administered Encounter Medications as of 03/11/2021  Medication   0.9 %  sodium chloride infusion    Allergies  (verified) Patient has no known allergies.   History: Past Medical History:  Diagnosis Date   AAA (abdominal aortic aneurysm) (HCC)    3.2 cm 2021   Allergy    Cataract    right eye with lens implant    Diabetes mellitus without complication (HCC)    GERD (gastroesophageal reflux disease)    some   Glaucoma    left eye   Hyperlipidemia    Hypertension    Past Surgical History:  Procedure Laterality Date   CATARACT EXTRACTION Bilateral    with lens implant 11-03-2014   COLONOSCOPY  2004   Dr MCollene Mares   TONSILLECTOMY AND ADENOIDECTOMY     age 80  Family History  Problem Relation Age of Onset   Cancer Mother        ovarian   Ovarian cancer Mother    Hypertension Father    Stroke Father    Diabetes Brother    Heart disease Brother    Stroke Brother    Colon cancer Neg Hx    Colon polyps Neg Hx    Esophageal cancer Neg Hx    Rectal cancer Neg Hx    Stomach cancer Neg Hx    Social History   Socioeconomic History   Marital status: Married    Spouse name: Not on file   Number of children: Not on file   Years of education: Not on file   Highest education level: Not on file  Occupational  History   Not on file  Tobacco Use   Smoking status: Former    Pack years: 0.00    Types: Cigarettes    Quit date: 04/24/1966    Years since quitting: 54.9   Smokeless tobacco: Never  Substance and Sexual Activity   Alcohol use: Yes   Drug use: No   Sexual activity: Yes    Birth control/protection: None  Other Topics Concern   Not on file  Social History Narrative   Not on file   Social Determinants of Health   Financial Resource Strain: Low Risk    Difficulty of Paying Living Expenses: Not hard at all  Food Insecurity: No Food Insecurity   Worried About Charity fundraiser in the Last Year: Never true   [place] in the Last Year: Never true  Transportation Needs: No Transportation Needs   Lack of Transportation (Medical): No   Lack of Transportation  (Non-Medical): No  Physical Activity: Insufficiently Active   Days of Exercise per Week: 3 days   Minutes of Exercise per Session: 30 min  Stress: No Stress Concern Present   Feeling of Stress : Not at all  Social Connections: Moderately Isolated   Frequency of Communication with Friends and Family: More than three times a week   Frequency of Social Gatherings with Friends and Family: More than three times a week   Attends Religious Services: Never   Marine scientist or Organizations: No   Attends Music therapist: Never   Marital Status: Married    Tobacco Counseling Counseling given: Not Answered   Clinical Intake:  Pre-visit preparation completed: Yes  Pain : No/denies pain     Nutritional Risks: None Diabetes: Yes CBG done?: No Did pt. bring in CBG monitor from home?: No  How often do you need to have someone help you when you read instructions, pamphlets, or other written materials from your doctor or pharmacy?: 1 - Never  Diabetic?yes Nutrition Risk Assessment:  Has the patient had any N/V/D within the last 2 months?  No  Does the patient have any non-healing wounds?  No  Has the patient had any unintentional weight loss or weight gain?  No   Diabetes:  Is the patient diabetic?  Yes  If diabetic, was a CBG obtained today?  No  Did the patient bring iPn their glucometer from home?  No  How often do you monitor your CBG's? Patient states checks glucose twice per week.   Financial Strains and Diabetes Management:  Are you having any financial strains with the device, your supplies or your medication? No .  Does the patient want to be seen by Chronic Care Management for management of their diabetes?  No  Would the patient like to be referred to a Nutritionist or for Diabetic Management?  No   Diabetic Exams:  Diabetic Eye Exam: Overdue for diabetic eye exam. Pt has been advised about the importance in completing this exam. Patient advised  to call and schedule an eye exam. Diabetic Foot Exam: Completed 09/21/2020   Interpreter Needed?: No  Information entered by :: [place] of Daily Living In your present state of health, do you have any difficulty performing the following activities: 03/11/2021  Hearing? N  Vision? N  Difficulty concentrating or making decisions? N  Walking or climbing stairs? N  Dressing or bathing? N  Doing errands, shopping? N  Preparing Food and eating ? N  Using the  Toilet? N  In the past six months, have you accidently leaked urine? N  Do you have problems with loss of bowel control? N  Managing your Medications? N  Managing your Finances? N  Some recent data might be hidden    Patient Care Team: Susy Frizzle, MD as PCP - General (Family Medicine)  Indicate any recent Medical Services you may have received from other than [facility] providers in the past year (date may be approximate).     Assessment:   This is a routine wellness examination for [place].  Hearing/Vision screen Vision Screening - Comments:: Patient gets eye exams once per year. Currently wears glasses for reading   Dietary issues and exercise activities discussed: Current Exercise Habits: Home exercise routine, Type of exercise: walking, Time (Minutes): 30, Frequency (Times/Week): 3, Weekly Exercise (Minutes/Week): 90, Intensity: Mild, Exercise limited by: None identified   Goals Addressed             This Visit's Progress    Exercise 3x per week (30 min per time)       HEMOGLOBIN A1C < 7         Depression Screen PHQ 2/9 Scores 03/11/2021 01/30/2020 06/17/2019 10/11/2018 06/27/2018 12/21/2017 09/21/2017  PHQ - 2 Score 0 0 0 0 0 0 0  PHQ- 9 Score - - - - - - -    Fall Risk Fall Risk  03/11/2021 01/30/2020 06/17/2019 10/11/2018 06/27/2018  Falls in the past year? 0 0 0 0 No  Number falls in past yr: 0 - 0 - -  Injury with Fall? 0 - 0 - -  Risk for fall due to : No Fall Risks No Fall Risks - - -  Follow  up Falls evaluation completed;Falls prevention discussed Falls evaluation completed - Falls evaluation completed -    FALL RISK PREVENTION PERTAINING TO THE HOME:  Any stairs in or around the home? No  If so, are there any without handrails? No  Home free of loose throw rugs in walkways, pet beds, electrical cords, etc? Yes  Adequate lighting in your home to reduce risk of falls? Yes   ASSISTIVE DEVICES UTILIZED TO PREVENT FALLS:  Life alert? No  Use of a cane, walker or w/c? No  Grab bars in the bathroom? Yes  Shower chair or bench in shower? Yes  Elevated toilet seat or a handicapped toilet? No   TIMED UP AND GO:  Was the test performed? Yes .  Length of time to ambulate 10 feet: 3 sec.   Gait steady and fast without use of assistive device  Cognitive Function:  Normal cognitive status assessed by direct observation by this Nurse Health Advisor. No abnormalities found.        Immunizations Immunization History  Administered Date(s) Administered   Fluad Quad(high Dose 65+) 06/04/2019   Influenza Split 06/12/2013, 06/02/2014   Influenza, High Dose Seasonal PF 06/02/2017, 06/08/2018   Influenza-Unspecified 06/15/2015, 06/02/2017, 06/08/2018   PFIZER(Purple Top)SARS-COV-2 Vaccination 11/16/2019, 12/11/2019, 03/09/2020, 07/09/2020   Pneumococcal Conjugate-13 08/14/2014   Pneumococcal Polysaccharide-23 07/25/2013   Tdap 07/25/2013   Zoster Recombinat (Shingrix) 09/09/2020, 03/09/2021    TDAP status: Up to date  Flu Vaccine status: Due, Education has been provided regarding the importance of this vaccine. Advised may receive this vaccine at local pharmacy or Health Dept. Aware to provide a copy of the vaccination record if obtained from local pharmacy or Health Dept. Verbalized acceptance and understanding.  Pneumococcal vaccine status: Up to date  Covid-19 vaccine  status: Completed vaccines  Qualifies for Shingles Vaccine? Yes   Zostavax completed No   Shingrix  Completed?: No.    Education has been provided regarding the importance of this vaccine. Patient has been advised to call insurance company to determine out of pocket expense if they have not yet received this vaccine. Advised may also receive vaccine at local pharmacy or Health Dept. Verbalized acceptance and understanding.  Screening Tests Health Maintenance  Topic Date Due   OPHTHALMOLOGY EXAM  01/27/2021   HEMOGLOBIN A1C  03/22/2021   INFLUENZA VACCINE  05/03/2021   FOOT EXAM  09/21/2021   COLONOSCOPY (Pts 45-31yr Insurance coverage will need to be confirmed)  07/18/2022   TETANUS/TDAP  07/26/2023   COVID-19 Vaccine  Completed   PNA vac Low Risk Adult  Completed   Zoster Vaccines- Shingrix  Completed   Pneumococcal Vaccine [id number] old  Aged Out   HPV VACCINES  Aged Out    Health Maintenance  Health Maintenance Due  Topic Date Due   OPHTHALMOLOGY EXAM  01/27/2021    Colorectal cancer screening: Type of screening: Colonoscopy. Completed 07/18/2017. Repeat every 5 years  Lung Cancer Screening: (Low Dose CT Chest recommended if Age 81-80years, 30 pack-year currently smoking OR have quit w/in 15years.) does not qualify.   Lung Cancer Screening Referral: N/A  Additional Screening:  Hepatitis C Screening: does not qualify;   Vision Screening: Recommended annual ophthalmology exams for early detection of glaucoma and other disorders of the eye. Is the patient up to date with their annual eye exam?  Yes  Who is the provider or what is the name of the office in which the patient attends annual eye exams? Dr.Whitaker If pt is not established with a provider, would they like to be referred to a provider to establish care? No .   Dental Screening: Recommended annual dental exams for proper oral hygiene  Community Resource Referral / Chronic Care Management: CRR required this visit?  No   CCM required this visit?  No      Plan:     I have personally reviewed and noted  the following in the patient's chart:   Medical and social history Use of alcohol, tobacco or illicit drugs  Current medications and supplements including opioid prescriptions. Patient is not currently taking opioid prescriptions. Functional ability and status Nutritional status Physical activity Advanced directives List of other physicians Hospitalizations, surgeries, and ER visits in previous 12 months Vitals Screenings to include cognitive, depression, and falls Referrals and appointments  In addition, I have reviewed and discussed with patient certain preventive protocols, quality metrics, and best practice recommendations. A written personalized care plan for preventive services as well as general preventive health recommendations were provided to patient.     SOfilia Neas LPN   68/02/276  Nurse Notes: None

## 2021-03-11 NOTE — Patient Instructions (Signed)
Aaron Mcfarland , Thank you for taking time to come for your Medicare Wellness Visit. I appreciate your ongoing commitment to your health goals. Please review the following plan we discussed and let me know if I can assist you in the future.   Screening recommendations/referrals: Colonoscopy: Up to date, next due 07/19/2027 Recommended yearly ophthalmology/optometry visit for glaucoma screening and checkup Recommended yearly dental visit for hygiene and checkup  Vaccinations: Influenza vaccine: Up to date, next due fall 2022  Pneumococcal vaccine: Completed series  Tdap vaccine: Up to date, next due 07/26/2023 Shingles vaccine: Completed series     Advanced directives: Advance directive discussed with you today. Even though you declined this today please call our office should you change your mind and we can give you the proper paperwork for you to fill out.   Conditions/risks identified: None   Next appointment: None  Preventive Care 65 Years and Older, Male Preventive care refers to lifestyle choices and visits with your health care provider that can promote health and wellness. What does preventive care include? A yearly physical exam. This is also called an annual well check. Dental exams once or twice a year. Routine eye exams. Ask your health care provider how often you should have your eyes checked. Personal lifestyle choices, including: Daily care of your teeth and gums. Regular physical activity. Eating a healthy diet. Avoiding tobacco and drug use. Limiting alcohol use. Practicing safe sex. Taking low doses of aspirin every day. Taking vitamin and mineral supplements as recommended by your health care provider. What happens during an annual well check? The services and screenings done by your health care provider during your annual well check will depend on your age, overall health, lifestyle risk factors, and family history of disease. Counseling  Your health care  provider may ask you questions about your: Alcohol use. Tobacco use. Drug use. Emotional well-being. Home and relationship well-being. Sexual activity. Eating habits. History of falls. Memory and ability to understand (cognition). Work and work Statistician. Screening  You may have the following tests or measurements: Height, weight, and BMI. Blood pressure. Lipid and cholesterol levels. These may be checked every 5 years, or more frequently if you are over 54 years old. Skin check. Lung cancer screening. You may have this screening every year starting at age 84 if you have a 30-pack-year history of smoking and currently smoke or have quit within the past 15 years. Fecal occult blood test (FOBT) of the stool. You may have this test every year starting at age 12. Flexible sigmoidoscopy or colonoscopy. You may have a sigmoidoscopy every 5 years or a colonoscopy every 10 years starting at age 7. Prostate cancer screening. Recommendations will vary depending on your family history and other risks. Hepatitis C blood test. Hepatitis B blood test. Sexually transmitted disease (STD) testing. Diabetes screening. This is done by checking your blood sugar (glucose) after you have not eaten for a while (fasting). You may have this done every 1-3 years. Abdominal aortic aneurysm (AAA) screening. You may need this if you are a current or former smoker. Osteoporosis. You may be screened starting at age 69 if you are at high risk. Talk with your health care provider about your test results, treatment options, and if necessary, the need for more tests. Vaccines  Your health care provider may recommend certain vaccines, such as: Influenza vaccine. This is recommended every year. Tetanus, diphtheria, and acellular pertussis (Tdap, Td) vaccine. You may need a Td booster every 10  years. Zoster vaccine. You may need this after age 74. Pneumococcal 13-valent conjugate (PCV13) vaccine. One dose is  recommended after age 15. Pneumococcal polysaccharide (PPSV23) vaccine. One dose is recommended after age 19. Talk to your health care provider about which screenings and vaccines you need and how often you need them. This information is not intended to replace advice given to you by your health care provider. Make sure you discuss any questions you have with your health care provider. Document Released: 10/16/2015 Document Revised: 06/08/2016 Document Reviewed: 07/21/2015 Elsevier Interactive Patient Education  2017 [place] Prevention in the Home Falls can cause injuries. They can happen to people of all ages. There are many things you can do to make your home safe and to help prevent falls. What can I do on the outside of my home? Regularly fix the edges of walkways and driveways and fix any cracks. Remove anything that might make you trip as you walk through a door, such as a raised step or threshold. Trim any bushes or trees on the path to your home. Use bright outdoor lighting. Clear any walking paths of anything that might make someone trip, such as rocks or tools. Regularly check to see if handrails are loose or broken. Make sure that both sides of any steps have handrails. Any raised decks and porches should have guardrails on the edges. Have any leaves, snow, or ice cleared regularly. Use sand or salt on walking paths during winter. Clean up any spills in your garage right away. This includes oil or grease spills. What can I do in the bathroom? Use night lights. Install grab bars by the toilet and in the tub and shower. Do not use towel bars as grab bars. Use non-skid mats or decals in the tub or shower. If you need to sit down in the shower, use a plastic, non-slip stool. Keep the floor dry. Clean up any water that spills on the floor as soon as it happens. Remove soap buildup in the tub or shower regularly. Attach bath mats securely with double-sided non-slip rug  tape. Do not have throw rugs and other things on the floor that can make you trip. What can I do in the bedroom? Use night lights. Make sure that you have a light by your bed that is easy to reach. Do not use any sheets or blankets that are too big for your bed. They should not hang down onto the floor. Have a firm chair that has side arms. You can use this for support while you get dressed. Do not have throw rugs and other things on the floor that can make you trip. What can I do in the kitchen? Clean up any spills right away. Avoid walking on wet floors. Keep items that you use a lot in easy-to-reach places. If you need to reach something above you, use a strong step stool that has a grab bar. Keep electrical cords out of the way. Do not use floor polish or wax that makes floors slippery. If you must use wax, use non-skid floor wax. Do not have throw rugs and other things on the floor that can make you trip. What can I do with my stairs? Do not leave any items on the stairs. Make sure that there are handrails on both sides of the stairs and use them. Fix handrails that are broken or loose. Make sure that handrails are as long as the stairways. Check any carpeting to make sure  that it is firmly attached to the stairs. Fix any carpet that is loose or worn. Avoid having throw rugs at the top or bottom of the stairs. If you do have throw rugs, attach them to the floor with carpet tape. Make sure that you have a light switch at the top of the stairs and the bottom of the stairs. If you do not have them, ask someone to add them for you. What else can I do to help prevent falls? Wear shoes that: Do not have high heels. Have rubber bottoms. Are comfortable and fit you well. Are closed at the toe. Do not wear sandals. If you use a stepladder: Make sure that it is fully opened. Do not climb a closed stepladder. Make sure that both sides of the stepladder are locked into place. Ask someone to  hold it for you, if possible. Clearly mark and make sure that you can see: Any grab bars or handrails. First and last steps. Where the edge of each step is. Use tools that help you move around (mobility aids) if they are needed. These include: Canes. Walkers. Scooters. Crutches. Turn on the lights when you go into a dark area. Replace any light bulbs as soon as they burn out. Set up your furniture so you have a clear path. Avoid moving your furniture around. If any of your floors are uneven, fix them. If there are any pets around you, be aware of where they are. Review your medicines with your doctor. Some medicines can make you feel dizzy. This can increase your chance of falling. Ask your doctor what other things that you can do to help prevent falls. This information is not intended to replace advice given to you by your health care provider. Make sure you discuss any questions you have with your health care provider. Document Released: 07/16/2009 Document Revised: 02/25/2016 Document Reviewed: 10/24/2014 Elsevier Interactive Patient Education  2017 [employer].

## 2021-04-06 ENCOUNTER — Other Ambulatory Visit: Payer: Self-pay | Admitting: Family Medicine

## 2021-04-06 DIAGNOSIS — I10 Essential (primary) hypertension: Secondary | ICD-10-CM

## 2021-04-28 ENCOUNTER — Other Ambulatory Visit: Payer: Self-pay | Admitting: Family Medicine

## 2021-05-17 ENCOUNTER — Other Ambulatory Visit: Payer: Self-pay | Admitting: Family Medicine

## 2021-05-25 DIAGNOSIS — H04123 Dry eye syndrome of bilateral lacrimal glands: Secondary | ICD-10-CM | POA: Diagnosis not present

## 2021-05-25 DIAGNOSIS — H401131 Primary open-angle glaucoma, bilateral, mild stage: Secondary | ICD-10-CM | POA: Diagnosis not present

## 2021-05-25 DIAGNOSIS — Z961 Presence of intraocular lens: Secondary | ICD-10-CM | POA: Diagnosis not present

## 2021-07-06 ENCOUNTER — Other Ambulatory Visit: Payer: Self-pay | Admitting: Family Medicine

## 2021-07-06 DIAGNOSIS — I10 Essential (primary) hypertension: Secondary | ICD-10-CM

## 2021-08-09 ENCOUNTER — Other Ambulatory Visit: Payer: Self-pay | Admitting: Family Medicine

## 2021-09-07 DIAGNOSIS — H401131 Primary open-angle glaucoma, bilateral, mild stage: Secondary | ICD-10-CM | POA: Diagnosis not present

## 2021-09-07 DIAGNOSIS — Z961 Presence of intraocular lens: Secondary | ICD-10-CM | POA: Diagnosis not present

## 2021-09-07 DIAGNOSIS — H04123 Dry eye syndrome of bilateral lacrimal glands: Secondary | ICD-10-CM | POA: Diagnosis not present

## 2021-10-07 ENCOUNTER — Other Ambulatory Visit: Payer: Self-pay | Admitting: Family Medicine

## 2021-10-07 DIAGNOSIS — I10 Essential (primary) hypertension: Secondary | ICD-10-CM

## 2021-11-09 DIAGNOSIS — H04123 Dry eye syndrome of bilateral lacrimal glands: Secondary | ICD-10-CM | POA: Diagnosis not present

## 2021-11-09 DIAGNOSIS — Z961 Presence of intraocular lens: Secondary | ICD-10-CM | POA: Diagnosis not present

## 2021-11-09 DIAGNOSIS — H401131 Primary open-angle glaucoma, bilateral, mild stage: Secondary | ICD-10-CM | POA: Diagnosis not present

## 2021-11-15 ENCOUNTER — Other Ambulatory Visit: Payer: Self-pay | Admitting: Family Medicine

## 2021-12-07 DIAGNOSIS — H401131 Primary open-angle glaucoma, bilateral, mild stage: Secondary | ICD-10-CM | POA: Diagnosis not present

## 2021-12-07 DIAGNOSIS — H04123 Dry eye syndrome of bilateral lacrimal glands: Secondary | ICD-10-CM | POA: Diagnosis not present

## 2021-12-07 DIAGNOSIS — Z961 Presence of intraocular lens: Secondary | ICD-10-CM | POA: Diagnosis not present

## 2021-12-10 ENCOUNTER — Other Ambulatory Visit: Payer: Self-pay | Admitting: Family Medicine

## 2022-01-11 DIAGNOSIS — Z961 Presence of intraocular lens: Secondary | ICD-10-CM | POA: Diagnosis not present

## 2022-01-11 DIAGNOSIS — H401131 Primary open-angle glaucoma, bilateral, mild stage: Secondary | ICD-10-CM | POA: Diagnosis not present

## 2022-01-11 DIAGNOSIS — H04123 Dry eye syndrome of bilateral lacrimal glands: Secondary | ICD-10-CM | POA: Diagnosis not present

## 2022-01-17 ENCOUNTER — Other Ambulatory Visit: Payer: Self-pay

## 2022-01-17 ENCOUNTER — Other Ambulatory Visit: Payer: Self-pay | Admitting: Family Medicine

## 2022-01-17 DIAGNOSIS — I10 Essential (primary) hypertension: Secondary | ICD-10-CM

## 2022-01-24 ENCOUNTER — Other Ambulatory Visit: Payer: Self-pay | Admitting: Family Medicine

## 2022-01-24 DIAGNOSIS — I10 Essential (primary) hypertension: Secondary | ICD-10-CM

## 2022-02-01 ENCOUNTER — Ambulatory Visit (INDEPENDENT_AMBULATORY_CARE_PROVIDER_SITE_OTHER): Payer: Medicare HMO | Admitting: Family Medicine

## 2022-02-01 VITALS — BP 160/84 | HR 67 | Temp 97.5°F | Ht 71.0 in | Wt 177.6 lb

## 2022-02-01 DIAGNOSIS — I714 Abdominal aortic aneurysm, without rupture, unspecified: Secondary | ICD-10-CM

## 2022-02-01 DIAGNOSIS — I10 Essential (primary) hypertension: Secondary | ICD-10-CM

## 2022-02-01 DIAGNOSIS — E119 Type 2 diabetes mellitus without complications: Secondary | ICD-10-CM

## 2022-02-01 MED ORDER — TRIAMTERENE-HCTZ 37.5-25 MG PO TABS: 1.0000 | ORAL_TABLET | Freq: Every day | ORAL | 3 refills | Status: DC

## 2022-02-01 NOTE — Progress Notes (Signed)
? ?Subjective:  ? ? Patient ID: Aaron Mcfarland, male    DOB: 04-04-1941, 81 y.o.   MRN: [id number] ? ?Medication Refill ? ?Patient is a 81 year old African-American gentleman with a history of diabetes, hypertension, hyperlipidemia.  I have not seen him in over 2 years.  Therefore we asked him to come in for an appointment before we will refill any additional medication prompting him to make this appointment.  He is overdue for fasting lab work.  His blood pressure is elevated however he has not been taking Maxide.  He denies any chest pain shortness of breath or dyspnea on exertion.  He states that he is consistently taking the benazepril, atorvastatin, and metformin.  He is overdue to recheck his AAA.  He does report blood sugars between 78 and 160.  He denies any hypoglycemic episodes.  He denies any abdominal pain nausea or vomiting melena or hematochezia. ?Past Medical History:  ?Diagnosis Date  ? AAA (abdominal aortic aneurysm) ([place])   ? 3.2 cm 2021  ? Allergy   ? Cataract   ? right eye with lens implant   ? Diabetes mellitus without complication ([place])   ? GERD (gastroesophageal reflux disease)   ? some  ? Glaucoma   ? left eye  ? Hyperlipidemia   ? Hypertension   ? ?Past Surgical History:  ?Procedure Laterality Date  ? CATARACT EXTRACTION Bilateral   ? with lens implant 11-03-2014  ? COLONOSCOPY  2004  ? Dr Collene Mares   ? TONSILLECTOMY AND ADENOIDECTOMY    ? age 81  ? ?Current Outpatient Medications on File Prior to Visit  ?Medication Sig Dispense Refill  ? aspirin 81 MG tablet Take 1 tablet (81 mg total) by mouth daily. 30 tablet 11  ? atorvastatin (LIPITOR) 10 MG tablet Take 1 tablet by mouth once daily with breakfast 90 tablet 3  ? benazepril (LOTENSIN) 10 MG tablet Take 1 tablet (10 mg total) by mouth daily. PT NEEDS OFFICE VISIT FOR FURTHER REFILLS/NOT SEEN SINCE 2021 30 tablet 0  ? Blood Glucose Monitoring Suppl (ONE TOUCH ULTRA SYSTEM KIT) W/DEVICE KIT 1 kit by Does not apply route once. 1 each 0  ?  COMBIGAN 0.2-0.5 % ophthalmic solution Apply 1 drop to eye 2 (two) times daily.    ? Fluticasone Propionate (FLONASE ALLERGY RELIEF NA) Place 2 sprays into the nose daily. Buying OTC    ? Lancets (ONETOUCH DELICA PLUS [id number]) MISC CHECK BLOOD SUGARS ONCE DAILY 100 each 0  ? latanoprost (XALATAN) 0.005 % ophthalmic solution Place 1 drop into the left eye at bedtime.    ? LUMIGAN 0.01 % SOLN SMARTSIG:In Eye(s)    ? metFORMIN (GLUCOPHAGE) 500 MG tablet Take 1 tablet by mouth twice daily 180 tablet 0  ? Multiple Vitamins-Minerals (MULTIVITAMIN PO) Take by mouth.    ? ONETOUCH ULTRA test strip CHECK BLOOD SUGAR EACH DAY FASTING 100 each 0  ? ?Current Facility-Administered Medications on File Prior to Visit  ?Medication Dose Route Frequency Provider Last Rate Last Admin  ? 0.9 %  sodium chloride infusion  500 mL Intravenous Continuous Pyrtle, Lajuan Lines, MD      ? ?No Known Allergies ?Social History  ? ?Socioeconomic History  ? Marital status: Married  ?  Spouse name: Not on file  ? Number of children: Not on file  ? Years of education: Not on file  ? Highest education level: Not on file  ?Occupational History  ? Not on file  ?Tobacco Use  ?  Smoking status: Former  ?  Types: Cigarettes  ?  Quit date: 04/24/1966  ?  Years since quitting: 55.8  ? Smokeless tobacco: Never  ?Substance and Sexual Activity  ? Alcohol use: Yes  ? Drug use: No  ? Sexual activity: Yes  ?  Birth control/protection: None  ?Other Topics Concern  ? Not on file  ?Social History Narrative  ? Not on file  ? ?Social Determinants of Health  ? ?Financial Resource Strain: Low Risk   ? Difficulty of Paying Living Expenses: Not hard at all  ?Food Insecurity: No Food Insecurity  ? Worried About Charity fundraiser in the Last Year: Never true  ? Ran Out of Food in the Last Year: Never true  ?Transportation Needs: No Transportation Needs  ? Lack of Transportation (Medical): No  ? Lack of Transportation (Non-Medical): No  ?Physical Activity: Insufficiently Active  ?  Days of Exercise per Week: 3 days  ? Minutes of Exercise per Session: 30 min  ?Stress: No Stress Concern Present  ? Feeling of Stress : Not at all  ?Social Connections: Moderately Isolated  ? Frequency of Communication with Friends and Family: More than three times a week  ? Frequency of Social Gatherings with Friends and Family: More than three times a week  ? Attends Religious Services: Never  ? Active Member of Clubs or Organizations: No  ? Attends Archivist Meetings: Never  ? Marital Status: Married  ?Intimate Partner Violence: Not At Risk  ? Fear of Current or Ex-Partner: No  ? Emotionally Abused: No  ? Physically Abused: No  ? Sexually Abused: No  ? ? ? ? ?Review of Systems  ?All other systems reviewed and are negative. ? ?   ?Objective:  ? Physical Exam ?Vitals reviewed.  ?Constitutional:   ?   General: He is not in acute distress. ?   Appearance: Normal appearance. He is normal weight. He is not ill-appearing.  ?Neck:  ?   Vascular: No carotid bruit.  ?Cardiovascular:  ?   Rate and Rhythm: Normal rate and regular rhythm.  ?   Pulses: Normal pulses.  ?   Heart sounds: Normal heart sounds. No murmur heard. ?  No friction rub. No gallop.  ?Pulmonary:  ?   Effort: Pulmonary effort is normal. No respiratory distress.  ?   Breath sounds: Normal breath sounds. No stridor. No wheezing, rhonchi or rales.  ?Chest:  ?   Chest wall: No tenderness.  ?Abdominal:  ?   General: Bowel sounds are normal. There is no distension.  ?   Palpations: Abdomen is soft. There is no mass.  ?   Tenderness: There is no abdominal tenderness. There is no right CVA tenderness, left CVA tenderness, guarding or rebound.  ?   Hernia: No hernia is present.  ?Musculoskeletal:  ?   Right lower leg: No edema.  ?   Left lower leg: No edema.  ?Lymphadenopathy:  ?   Cervical: No cervical adenopathy.  ?Neurological:  ?   Mental Status: He is alert.  ? ? ? ? ? ?   ?Assessment & Plan:  ?Essential hypertension - Plan:  triamterene-hydrochlorothiazide (MAXZIDE-25) 37.5-25 MG tablet ? ?Diabetes mellitus without complication ([place]) - Plan: CBC with Differential/Platelet, Lipid panel, Microalbumin, urine, COMPLETE METABOLIC PANEL WITH GFR, Hemoglobin A1c ?Blood pressure is elevated today.  Resume Maxide and recheck blood pressure in 2 weeks.  Check CBC CMP lipid panel and A1c.  Goal hemoglobin A1c is less than 7.  Goal LDL cholesterol is less than 100.  Schedule the patient for an ultrasound of the abdomen to monitor his AAA. ?

## 2022-02-02 LAB — CBC WITH DIFFERENTIAL/PLATELET
Absolute Monocytes: 451 cells/uL (ref 200–950)
Basophils Absolute: 32 cells/uL (ref 0–200)
Basophils Relative: 0.6 %
Eosinophils Absolute: 201 cells/uL (ref 15–500)
Eosinophils Relative: 3.8 %
HCT: 39 % (ref 38.5–50.0)
Hemoglobin: 13.5 g/dL (ref 13.2–17.1)
Lymphs Abs: 1829 cells/uL (ref 850–3900)
MCH: 33.8 pg — ABNORMAL HIGH (ref 27.0–33.0)
MCHC: 34.6 g/dL (ref 32.0–36.0)
MCV: 97.7 fL (ref 80.0–100.0)
MPV: 10 fL (ref 7.5–12.5)
Monocytes Relative: 8.5 %
Neutro Abs: 2788 cells/uL (ref 1500–7800)
Neutrophils Relative %: 52.6 %
Platelets: 297 10*3/uL (ref 140–400)
RBC: 3.99 10*6/uL — ABNORMAL LOW (ref 4.20–5.80)
RDW: 12.6 % (ref 11.0–15.0)
Total Lymphocyte: 34.5 %
WBC: 5.3 10*3/uL (ref 3.8–10.8)

## 2022-02-02 LAB — LIPID PANEL
Cholesterol: 198 mg/dL (ref <200)
HDL: 66 mg/dL (ref >=40)
LDL Cholesterol (Calc): 109 mg/dL (calc) — ABNORMAL HIGH
Non-HDL Cholesterol (Calc): 132 mg/dL (calc) — ABNORMAL HIGH (ref <130)
Total CHOL/HDL Ratio: 3 (calc) (ref <5.0)
Triglycerides: 124 mg/dL (ref <150)

## 2022-02-02 LAB — COMPLETE METABOLIC PANEL WITH GFR
AG Ratio: 1.5 (calc) (ref 1.0–2.5)
ALT: 22 U/L (ref 9–46)
AST: 15 U/L (ref 10–35)
Albumin: 4.6 g/dL (ref 3.6–5.1)
Alkaline phosphatase (APISO): 83 U/L (ref 35–144)
BUN: 16 mg/dL (ref 7–25)
CO2: 26 mmol/L (ref 20–32)
Calcium: 10.3 mg/dL (ref 8.6–10.3)
Chloride: 105 mmol/L (ref 98–110)
Creat: 1.2 mg/dL (ref 0.70–1.22)
Globulin: 3 g/dL (calc) (ref 1.9–3.7)
Glucose, Bld: 124 mg/dL — ABNORMAL HIGH (ref 65–99)
Potassium: 4.7 mmol/L (ref 3.5–5.3)
Sodium: 140 mmol/L (ref 135–146)
Total Bilirubin: 0.7 mg/dL (ref 0.2–1.2)
Total Protein: 7.6 g/dL (ref 6.1–8.1)
eGFR: 61 mL/min/{1.73_m2} (ref >=60)

## 2022-02-02 LAB — HEMOGLOBIN A1C
Hgb A1c MFr Bld: 7.1 % of total Hgb — ABNORMAL HIGH (ref <5.7)
Mean Plasma Glucose: 157 mg/dL
eAG (mmol/L): 8.7 mmol/L

## 2022-02-02 LAB — MICROALBUMIN, URINE: Microalb, Ur: 0.2 mg/dL

## 2022-02-10 DIAGNOSIS — Z961 Presence of intraocular lens: Secondary | ICD-10-CM | POA: Diagnosis not present

## 2022-02-10 DIAGNOSIS — H401131 Primary open-angle glaucoma, bilateral, mild stage: Secondary | ICD-10-CM | POA: Diagnosis not present

## 2022-02-10 DIAGNOSIS — H04123 Dry eye syndrome of bilateral lacrimal glands: Secondary | ICD-10-CM | POA: Diagnosis not present

## 2022-02-11 ENCOUNTER — Ambulatory Visit
Admission: RE | Admit: 2022-02-11 | Discharge: 2022-02-11 | Disposition: A | Discharge status: Home / Self Care | Payer: Medicare HMO | Source: Ambulatory Visit | Attending: Family Medicine | Admitting: Family Medicine

## 2022-02-11 ENCOUNTER — Other Ambulatory Visit: Payer: Medicare HMO

## 2022-02-11 DIAGNOSIS — I714 Abdominal aortic aneurysm, without rupture, unspecified: Secondary | ICD-10-CM

## 2022-02-11 DIAGNOSIS — Z136 Encounter for screening for cardiovascular disorders: Secondary | ICD-10-CM | POA: Diagnosis not present

## 2022-02-18 DIAGNOSIS — H401131 Primary open-angle glaucoma, bilateral, mild stage: Secondary | ICD-10-CM | POA: Diagnosis not present

## 2022-02-21 ENCOUNTER — Other Ambulatory Visit: Payer: Self-pay | Admitting: Family Medicine

## 2022-02-22 NOTE — Telephone Encounter (Signed)
Requested Prescriptions  Pending Prescriptions Disp Refills  . metFORMIN (GLUCOPHAGE) 500 MG tablet [Pharmacy Med Name: metFORMIN HCl 500 MG Oral Tablet] 180 tablet 1    Sig: Take 1 tablet by mouth twice daily     Endocrinology:  Diabetes - Biguanides Failed - 02/21/2022  2:35 PM      Failed - B12 Level in normal range and within 720 days    Vitamin B-12  Date Value Ref Range Status  01/30/2020 >2,000 (H) 200 - 1,100 pg/mL Final         Passed - Cr in normal range and within 360 days    Creat  Date Value Ref Range Status  02/01/2022 1.20 0.70 - 1.22 mg/dL Final         Passed - HBA1C is between 0 and 7.9 and within 180 days    Hgb A1C (fingerstick)  Date Value Ref Range Status  03/22/2017 5.9 (H) <5.7 % Final    Comment:                                                                           According to the ADA Clinical Practice Recommendations for 2011, when HbA1c is used as a screening test:     >=6.5%   Diagnostic of Diabetes Mellitus            (if abnormal result is confirmed)   5.7-6.4%   Increased risk of developing Diabetes Mellitus   References:Diagnosis and Classification of Diabetes Mellitus,Diabetes JEHU,3149,70(YOVZC 1):S62-S69 and Standards of Medical Care in         Diabetes - 2011,Diabetes Care,2011,34 (Suppl 1):S11-S61.      Hgb A1c MFr Bld  Date Value Ref Range Status  02/01/2022 7.1 (H) <5.7 % of total Hgb Final    Comment:    For someone without known diabetes, a hemoglobin A1c value of 6.5% or greater indicates that they may have  diabetes and this should be confirmed with a follow-up  test. . For someone with known diabetes, a value <7% indicates  that their diabetes is well controlled and a value  greater than or equal to 7% indicates suboptimal  control. A1c targets should be individualized based on  duration of diabetes, age, comorbid conditions, and  other considerations. . Currently, no consensus exists regarding use of hemoglobin  A1c for diagnosis of diabetes for children. .          Passed - eGFR in normal range and within 360 days    GFR, Est African American  Date Value Ref Range Status  09/21/2020 59 (L) > OR = 60 mL/min/1.35m Final   GFR, Est Non African American  Date Value Ref Range Status  09/21/2020 51 (L) > OR = 60 mL/min/1.738mFinal   eGFR  Date Value Ref Range Status  02/01/2022 61 > OR = 60 mL/min/1.739minal    Comment:    The eGFR is based on the CKD-EPI 2021 equation. To calculate  the new eGFR from a previous Creatinine or Cystatin C result, go to https://www.kidney.org/professionals/ kdoqi/gfr%5Fcalculator          Passed - Valid encounter within last 6 months    Recent Outpatient Visits  3 weeks ago Essential hypertension   [place] Pickard, Cammie Mcgee, MD   1 year ago Diabetes mellitus without complication [facility])   [place] Pickard, Cammie Mcgee, MD   2 years ago Diabetes mellitus without complication ([place])   [place] Susy Frizzle, MD   2 years ago Diabetes mellitus without complication ([place])   [place] Pickard, Cammie Mcgee, MD   3 years ago Essential hypertension   [place] Pickard, Cammie Mcgee, MD             Passed - CBC within normal limits and completed in the last 12 months    WBC  Date Value Ref Range Status  02/01/2022 5.3 3.8 - 10.8 Thousand/uL Final   RBC  Date Value Ref Range Status  02/01/2022 3.99 (L) 4.20 - 5.80 Million/uL Final   Hemoglobin  Date Value Ref Range Status  02/01/2022 13.5 13.2 - 17.1 g/dL Final   HCT  Date Value Ref Range Status  02/01/2022 39.0 38.5 - 50.0 % Final   MCHC  Date Value Ref Range Status  02/01/2022 34.6 32.0 - 36.0 g/dL Final   [facility]  Date Value Ref Range Status  02/01/2022 33.8 (H) 27.0 - 33.0 pg Final   MCV  Date Value Ref Range Status  02/01/2022 97.7 80.0 - 100.0 fL Final   No results found for:  PLTCOUNTKUC, LABPLAT, POCPLA RDW  Date Value Ref Range Status  02/01/2022 12.6 11.0 - 15.0 % Final

## 2022-03-01 ENCOUNTER — Other Ambulatory Visit: Payer: Self-pay | Admitting: Family Medicine

## 2022-03-11 ENCOUNTER — Other Ambulatory Visit: Payer: Self-pay | Admitting: Family Medicine

## 2022-03-11 NOTE — Telephone Encounter (Signed)
Requested Prescriptions  Pending Prescriptions Disp Refills  . atorvastatin (LIPITOR) 10 MG tablet [Pharmacy Med Name: Atorvastatin Calcium 10 MG Oral Tablet] 90 tablet 3    Sig: Take 1 tablet by mouth once daily with breakfast     There is no refill protocol information for this order

## 2022-04-14 ENCOUNTER — Other Ambulatory Visit: Payer: Self-pay | Admitting: Family Medicine

## 2022-04-14 NOTE — Telephone Encounter (Signed)
Requested Prescriptions  Pending Prescriptions Disp Refills  . benazepril (LOTENSIN) 10 MG tablet [Pharmacy Med Name: Benazepril HCl 10 MG Oral Tablet] 90 tablet 1    Sig: TAKE 1 TABLET BY MOUTH ONCE DAILY . APPOINTMENT REQUIRED FOR FUTURE REFILLS     Cardiovascular:  ACE Inhibitors Failed - 04/14/2022 11:16 AM      Failed - Last BP in normal range    BP Readings from Last 1 Encounters:  02/01/22 (!) 160/84         Passed - Cr in normal range and within 180 days    Creat  Date Value Ref Range Status  02/01/2022 1.20 0.70 - 1.22 mg/dL Final         Passed - K in normal range and within 180 days    Potassium  Date Value Ref Range Status  02/01/2022 4.7 3.5 - 5.3 mmol/L Final         Passed - Patient is not pregnant      Passed - Valid encounter within last 6 months    Recent Outpatient Visits          2 months ago Essential hypertension   [place], Warren T, MD   1 year ago Diabetes mellitus without complication ([place])   [place] Susy Frizzle, MD   2 years ago Diabetes mellitus without complication [facility])   [place] Susy Frizzle, MD   2 years ago Diabetes mellitus without complication ([place])   [place] Pickard, Cammie Mcgee, MD   3 years ago Essential hypertension   [place], Cammie Mcgee, MD

## 2022-04-20 DIAGNOSIS — Z961 Presence of intraocular lens: Secondary | ICD-10-CM | POA: Diagnosis not present

## 2022-04-20 DIAGNOSIS — H401131 Primary open-angle glaucoma, bilateral, mild stage: Secondary | ICD-10-CM | POA: Diagnosis not present

## 2022-04-20 DIAGNOSIS — H04123 Dry eye syndrome of bilateral lacrimal glands: Secondary | ICD-10-CM | POA: Diagnosis not present

## 2022-07-21 DIAGNOSIS — H04123 Dry eye syndrome of bilateral lacrimal glands: Secondary | ICD-10-CM | POA: Diagnosis not present

## 2022-07-21 DIAGNOSIS — Z961 Presence of intraocular lens: Secondary | ICD-10-CM | POA: Diagnosis not present

## 2022-07-21 DIAGNOSIS — H401131 Primary open-angle glaucoma, bilateral, mild stage: Secondary | ICD-10-CM | POA: Diagnosis not present

## 2022-07-27 ENCOUNTER — Encounter: Payer: Self-pay | Admitting: Internal Medicine

## 2022-08-01 ENCOUNTER — Telehealth: Payer: Self-pay | Admitting: Family Medicine

## 2022-08-01 NOTE — Telephone Encounter (Signed)
Left message to return call; need to change to telephone call.

## 2022-08-03 ENCOUNTER — Ambulatory Visit (INDEPENDENT_AMBULATORY_CARE_PROVIDER_SITE_OTHER): Payer: Medicare HMO

## 2022-08-03 VITALS — BP 130/80 | HR 65 | Temp 97.7°F | Ht 71.0 in | Wt 173.0 lb

## 2022-08-03 DIAGNOSIS — Z0000 Encounter for general adult medical examination without abnormal findings: Secondary | ICD-10-CM | POA: Diagnosis not present

## 2022-08-03 NOTE — Progress Notes (Signed)
Subjective:   Aaron Mcfarland is a 81 y.o. male who presents for Medicare Annual/Subsequent preventive examination.  Review of Systems    Endocrin Cardiovascular  Digestive      Objective:    Today's Vitals   08/03/22 1509  BP: 130/80  Pulse: 65  Temp: 97.7 F (36.5 C)  TempSrc: Oral  SpO2: 100%  Weight: 173 lb (78.5 kg)  Height: _0  (1.803 m)   Body mass index is 24.13 kg/m.     03/11/2021   12:08 PM 07/18/2017    7:44 AM 07/04/2017    8:54 AM  Advanced Directives  Does Patient Have a Medical Advance Directive? No No No  Would patient like information on creating a medical advance directive? No - Patient declined      Current Medications (verified) Outpatient Encounter Medications as of 08/03/2022  Medication Sig   aspirin 81 MG tablet Take 1 tablet (81 mg total) by mouth daily.   atorvastatin (LIPITOR) 10 MG tablet Take 1 tablet by mouth once daily with breakfast   benazepril (LOTENSIN) 10 MG tablet TAKE 1 TABLET BY MOUTH ONCE DAILY . APPOINTMENT REQUIRED FOR FUTURE REFILLS   Blood Glucose Monitoring Suppl (ONE TOUCH ULTRA SYSTEM KIT) W/DEVICE KIT 1 kit by Does not apply route once.   COMBIGAN 0.2-0.5 % ophthalmic solution Apply 1 drop to eye 2 (two) times daily.   Fluticasone Propionate (FLONASE ALLERGY RELIEF NA) Place 2 sprays into the nose daily. Buying OTC   Lancets (ONETOUCH DELICA PLUS [id number]) MISC CHECK BLOOD SUGARS ONCE DAILY   latanoprost (XALATAN) 0.005 % ophthalmic solution Place 1 drop into the left eye at bedtime.   LUMIGAN 0.01 % SOLN SMARTSIG:In Eye(s)   metFORMIN (GLUCOPHAGE) 500 MG tablet Take 1 tablet by mouth twice daily   Multiple Vitamins-Minerals (MULTIVITAMIN PO) Take by mouth.   ONETOUCH ULTRA test strip CHECK BLOOD SUGARS EACH DAY FASTING.   triamterene-hydrochlorothiazide (MAXZIDE-25) 37.5-25 MG tablet Take 1 tablet by mouth daily.   Facility-Administered Encounter Medications as of 08/03/2022  Medication   0.9 %  sodium  chloride infusion    Allergies (verified) Patient has no known allergies.   History: Past Medical History:  Diagnosis Date   AAA (abdominal aortic aneurysm) (HCC)    3.2 cm 2021   Allergy    Cataract    right eye with lens implant    Diabetes mellitus without complication (HCC)    GERD (gastroesophageal reflux disease)    some   Glaucoma    left eye   Hyperlipidemia    Hypertension    Past Surgical History:  Procedure Laterality Date   CATARACT EXTRACTION Bilateral    with lens implant 11-03-2014   COLONOSCOPY  2004   Dr Collene Mares    TONSILLECTOMY AND ADENOIDECTOMY     age 61   Family History  Problem Relation Age of Onset   Cancer Mother        ovarian   Ovarian cancer Mother    Hypertension Father    Stroke Father    Diabetes Brother    Heart disease Brother    Stroke Brother    Colon cancer Neg Hx    Colon polyps Neg Hx    Esophageal cancer Neg Hx    Rectal cancer Neg Hx    Stomach cancer Neg Hx    Social History   Socioeconomic History   Marital status: Married    Spouse name: Not on file   Number of children:  Not on file   Years of education: Not on file   Highest education level: Not on file  Occupational History   Not on file  Tobacco Use   Smoking status: Former    Types: Cigarettes    Quit date: 04/24/1966    Years since quitting: 56.3   Smokeless tobacco: Never  Substance and Sexual Activity   Alcohol use: Yes   Drug use: No   Sexual activity: Yes    Birth control/protection: None  Other Topics Concern   Not on file  Social History Narrative   Not on file   Social Determinants of Health   Financial Resource Strain: Low Risk  (08/03/2022)   Overall Financial Resource Strain (CARDIA)    Difficulty of Paying Living Expenses: Not hard at all  Food Insecurity: No Food Insecurity (08/03/2022)   Hunger Vital Sign    Worried About Running Out of Food in the Last Year: Never true    Ran Out of Food in the Last Year: Never true  Transportation  Needs: No Transportation Needs (08/03/2022)   PRAPARE - Hydrologist (Medical): No    Lack of Transportation (Non-Medical): No  Physical Activity: Insufficiently Active (08/03/2022)   Exercise Vital Sign    Days of Exercise per Week: 3 days    Minutes of Exercise per Session: 30 min  Stress: No Stress Concern Present (08/03/2022)   [place]    Feeling of Stress : Not at all  Social Connections: Moderately Isolated (08/03/2022)   Social Connection and Isolation Panel [NHANES]    Frequency of Communication with Friends and Family: Three times a week    Frequency of Social Gatherings with Friends and Family: Three times a week    Attends Religious Services: Never    Active Member of Clubs or Organizations: No    Attends Music therapist: Never    Marital Status: Married    Tobacco Counseling Counseling given: Not Answered   Clinical Intake:  Pre-visit preparation completed: Yes  Pain : No/denies pain     BMI - recorded: 24.13 Nutritional Status: BMI 25 -29 Overweight Diabetes: Yes CBG done?: Yes (112 at home per pt in the morning) CBG resulted in Enter/ Edit results?: No (112)  How often do you need to have someone help you when you read instructions, pamphlets, or other written materials from your doctor or pharmacy?: 1 - Never What is the last grade level you completed in school?: 12  Diabetic?yes Nutrition Risk Assessment:  Has the patient had any N/V/D within the last 2 months?  No  Does the patient have any non-healing wounds?  No  Has the patient had any unintentional weight loss or weight gain?  No   Diabetes:  Is the patient diabetic?  yes If diabetic, was a CBG obtained today?  112 this morning per pt Did the patient bring in their glucometer from home?  No  How often do you monitor your CBG's? A couple times a week.   Financial Strains and Diabetes  Management:  Are you having any financial strains with the device, your supplies or your medication? No .  Does the patient want to be seen by Chronic Care Management for management of their diabetes?  No  Would the patient like to be referred to a Nutritionist or for Diabetic Management?  No   Diabetic Exams:  Diabetic Eye Exam: Completed 07/1922 Diabetic Foot Exam:  Completed 02/01/22    Interpreter Needed?: No      Activities of Daily Living     No data to display          Patient Care Team: Susy Frizzle, MD as PCP - General (Family Medicine)  Indicate any recent Medical Services you may have received from other than [facility] providers in the past year (date may be approximate).     Assessment:   This is a routine wellness examination for [place].  Hearing/Vision screen No results found.  Dietary issues and exercise activities discussed:     Goals Addressed   None   Depression Screen    08/03/2022    3:02 PM 03/11/2021   12:09 PM 01/30/2020    8:08 AM 06/17/2019   10:34 AM 10/11/2018    8:15 AM 06/27/2018    8:18 AM 12/21/2017    8:10 AM  PHQ 2/9 Scores  PHQ - 2 Score 0 0 0 0 0 0 0  PHQ- 9 Score 0          Fall Risk    08/03/2022    3:02 PM 03/11/2021   12:09 PM 01/30/2020    8:08 AM 06/17/2019   10:34 AM 10/11/2018    8:15 AM  Fall Risk   Falls in the past year? 0 0 0 0 0  Number falls in past yr:  0  0   Injury with Fall?  0  0   Risk for fall due to :  No Fall Risks No Fall Risks    Follow up  Falls evaluation completed;Falls prevention discussed Falls evaluation completed  Falls evaluation completed    FALL RISK PREVENTION PERTAINING TO THE HOME:  Any stairs in or around the home? No  If so, are there any without handrails? No  Home free of loose throw rugs in walkways, pet beds, electrical cords, etc? Yes  Adequate lighting in your home to reduce risk of falls? Yes   ASSISTIVE DEVICES UTILIZED TO PREVENT FALLS:  Life alert? No  Use of a cane,  walker or w/c? No  Grab bars in the bathroom? Yes  Shower chair or bench in shower? Yes  Elevated toilet seat or a handicapped toilet? Yes   TIMED UP AND GO:  Was the test performed? Yes .  Length of time to ambulate 10 feet: 5 sec.   Gait steady and fast without use of assistive device  Cognitive Function:        Immunizations Immunization History  Administered Date(s) Administered   Fluad Quad(high Dose 65+) 06/04/2019   Influenza Split 06/12/2013, 06/02/2014   Influenza, High Dose Seasonal PF 06/02/2017, 06/08/2018   Influenza-Unspecified 06/15/2015, 06/02/2017, 06/08/2018   PFIZER(Purple Top)SARS-COV-2 Vaccination 11/16/2019, 12/11/2019, 03/09/2020, 07/09/2020   Pneumococcal Conjugate-13 08/14/2014   Pneumococcal Polysaccharide-23 07/25/2013   Tdap 07/25/2013   Zoster Recombinat (Shingrix) 09/09/2020, 03/09/2021    TDAP status: Due, Education has been provided regarding the importance of this vaccine. Advised may receive this vaccine at local pharmacy or Health Dept. Aware to provide a copy of the vaccination record if obtained from local pharmacy or Health Dept. Verbalized acceptance and understanding.  Flu Vaccine status: Up to date  Pneumococcal vaccine status: Completed during today's visit.  Covid-19 vaccine status: Declined, Education has been provided regarding the importance of this vaccine but patient still declined. Advised may receive this vaccine at local pharmacy or Health Dept.or vaccine clinic. Aware to provide a copy of the vaccination record if obtained  from local pharmacy or Health Dept. Verbalized acceptance and understanding.  Qualifies for Shingles Vaccine? Yes   Zostavax completed NO Shingrix Completed?: No.    Education has been provided regarding the importance of this vaccine. Patient has been advised to call insurance company to determine out of pocket expense if they have not yet received this vaccine. Advised may also receive vaccine at local  pharmacy or Health Dept. Verbalized acceptance and understanding.  Screening Tests Health Maintenance  Topic Date Due   Diabetic kidney evaluation - Urine ACR  Never done   COVID-19 Vaccine (5 - Pfizer series) 09/03/2020   OPHTHALMOLOGY EXAM  01/27/2021   INFLUENZA VACCINE  05/03/2022   COLONOSCOPY (Pts 45-73yr Insurance coverage will need to be confirmed)  07/18/2022   HEMOGLOBIN A1C  08/04/2022   Diabetic kidney evaluation - GFR measurement  02/02/2023   FOOT EXAM  02/02/2023   TETANUS/TDAP  07/26/2023   Medicare Annual Wellness (AWV)  08/04/2023   Pneumonia Vaccine 81 Years old  Completed   Zoster Vaccines- Shingrix  Completed   HPV VACCINES  Aged Out    Health Maintenance  Health Maintenance Due  Topic Date Due   Diabetic kidney evaluation - Urine ACR  Never done   COVID-19 Vaccine (5 - Pfizer series) 09/03/2020   OPHTHALMOLOGY EXAM  01/27/2021   INFLUENZA VACCINE  05/03/2022   COLONOSCOPY (Pts 45-422yrInsurance coverage will need to be confirmed)  07/18/2022    Colorectal cancer screening: Type of screening: Colonoscopy. Completed 2018. Repeat every 5 years  Lung Cancer Screening: (Low Dose CT Chest recommended if Age 82-80ears, 30 pack-year currently smoking OR have quit w/in 15years.) does not qualify.   Lung Cancer Screening Referral: n/a  Additional Screening:  Hepatitis C Screening: does not qualify; Completed n/a  Vision Screening: Recommended annual ophthalmology exams for early detection of glaucoma and other disorders of the eye. Is the patient up to date with their annual eye exam?  Yes  Who is the provider or what is the name of the office in which the patient attends annual eye exams? Dr. WhPerrin Maltesef pt is not established with a provider, would they like to be referred to a provider to establish care? No .   Dental Screening: Recommended annual dental exams for proper oral hygiene  Community Resource Referral / Chronic Care  Management: CRR required this visit?  No   CCM required this visit?  No      Plan:     I have personally reviewed and noted the following in the patient's chart:   Medical and social history Use of alcohol, tobacco or illicit drugs  Current medications and supplements including opioid prescriptions. Patient is not currently taking opioid prescriptions. Functional ability and status Nutritional status Physical activity Advanced directives List of other physicians Hospitalizations, surgeries, and ER visits in previous 12 months Vitals Screenings to include cognitive, depression, and falls Referrals and appointments  In addition, I have reviewed and discussed with patient certain preventive protocols, quality metrics, and best practice recommendations. A written personalized care plan for preventive services as well as general preventive health recommendations were provided to patient.     BlColman CaterCMEglin AFB 08/03/2022   Nurse Notes: n/a

## 2022-08-08 ENCOUNTER — Other Ambulatory Visit: Payer: Self-pay | Admitting: Family Medicine

## 2022-08-23 DIAGNOSIS — H04123 Dry eye syndrome of bilateral lacrimal glands: Secondary | ICD-10-CM | POA: Diagnosis not present

## 2022-08-23 DIAGNOSIS — H401131 Primary open-angle glaucoma, bilateral, mild stage: Secondary | ICD-10-CM | POA: Diagnosis not present

## 2022-08-23 DIAGNOSIS — Z961 Presence of intraocular lens: Secondary | ICD-10-CM | POA: Diagnosis not present

## 2022-09-12 ENCOUNTER — Other Ambulatory Visit: Payer: Self-pay | Admitting: Family Medicine

## 2022-09-23 ENCOUNTER — Encounter: Payer: Self-pay | Admitting: Nurse Practitioner

## 2022-10-21 ENCOUNTER — Encounter: Payer: Self-pay | Admitting: Nurse Practitioner

## 2022-10-21 ENCOUNTER — Ambulatory Visit: Payer: Medicare HMO | Admitting: Nurse Practitioner

## 2022-10-21 VITALS — BP 138/74 | HR 67 | Ht 71.0 in | Wt 173.0 lb

## 2022-10-21 DIAGNOSIS — Z86010 Personal history of colonic polyps: Secondary | ICD-10-CM

## 2022-10-21 DIAGNOSIS — E119 Type 2 diabetes mellitus without complications: Secondary | ICD-10-CM

## 2022-10-21 MED ORDER — NA SULFATE-K SULFATE-MG SULF 17.5-3.13-1.6 GM/177ML PO SOLN: 1.0000 | Freq: Once | ORAL | 0 refills | Status: AC

## 2022-10-21 NOTE — Patient Instructions (Signed)
You have been scheduled for a colonoscopy. Please follow written instructions given to you at your visit today.  Please pick up your prep supplies at the pharmacy within the next 1-3 days. If you use inhalers (even only as needed), please bring them with you on the day of your procedure.  Due to recent changes in healthcare laws, you may see the results of your imaging and laboratory studies on MyChart before your provider has had a chance to review them.  We understand that in some cases there may be results that are confusing or concerning to you. Not all laboratory results come back in the same time frame and the provider may be waiting for multiple results in order to interpret others.  Please give us 48 hours in order for your provider to thoroughly review all the results before contacting the office for clarification of your results.   Thank you for trusting me with your gastrointestinal care!   Colleen Kennedy-Smith, CRNP   

## 2022-10-21 NOTE — Progress Notes (Signed)
Addendum: Reviewed and agree with assessment and management plan. Jasiel Apachito M, MD  

## 2022-10-21 NOTE — Progress Notes (Signed)
10/21/2022 Aaron Mcfarland [id number] 02/09/1941   CHIEF COMPLAINT: Schedule colonoscopy  HISTORY OF PRESENT ILLNESS: Aaron Mcfarland is an 82 year old male with a past medical history of hypertension, hyperlipidemia, DM type II, glaucoma and colon polyps. He presents today to schedule a colonoscopy.  He is known by Dr. Hilarie Fredrickson.  He denies having any abdominal pain.  He is passing normal formed brown bowel movement daily.  No rectal bleeding or black stools.  He underwent a colonoscopy 07/18/2017 which identified 4 polyps measuring 3 to 9 mm removed from the transverse, descending and sigmoid colon.  Path report showed 2 sessile serrated polyps and 2 hyperplastic polyps.  A repeat colonoscopy in 5 years was recommended.  No known family history of colorectal cancer.  He infrequently has reflux symptoms and stomach gas discomfort if he eats spicy foods.  He infrequently takes an OTC antiacid.  No dysphagia.  He remains quite active at the age of 3.  Appetite is good and weight is stable.  No pulmonary or cardiac disease.  Aortic ultrasound 01/2022 without evidence of an aortic aneurysm.     Latest Ref Rng & Units 02/01/2022   11:22 AM 09/21/2020   10:26 AM 01/30/2020    8:26 AM  CBC  WBC 3.8 - 10.8 Thousand/uL 5.3  6.3  5.6   Hemoglobin 13.2 - 17.1 g/dL 13.5  13.7  12.8   Hematocrit 38.5 - 50.0 % 39.0  39.2  36.9   Platelets 140 - 400 Thousand/uL 297  313  297        Latest Ref Rng & Units 02/01/2022   11:22 AM 09/21/2020   10:26 AM 01/30/2020    8:26 AM  CMP  Glucose 65 - 99 mg/dL 124  124  153   BUN 7 - 25 mg/dL '16  17  28   '$ Creatinine 0.70 - 1.22 mg/dL 1.20  1.31  1.29   Sodium 135 - 146 mmol/L 140  138  140   Potassium 3.5 - 5.3 mmol/L 4.7  4.3  4.4   Chloride 98 - 110 mmol/L 105  102  105   CO2 20 - 32 mmol/L '26  25  23   '$ Calcium 8.6 - 10.3 mg/dL 10.3  10.1  10.0   Total Protein 6.1 - 8.1 g/dL 7.6  7.6  7.2   Total Bilirubin 0.2 - 1.2 mg/dL 0.7  0.7  0.8   AST 10 - 35  U/L '15  17  15   '$ ALT 9 - 46 U/L '22  23  16     '$ Aorta ultrasound 02/11/2022:  FINDINGS: Abdominal aortic measurements as follows: AP x TRV   Proximal:  2.8 x 2.7 cm   Mid:  2.0 x 2.1 cm   Distal:  1.9 x 1.9 cm Patent: Yes, peak systolic velocity is 88 cm/s   Right common iliac artery: 1.3 x 1.2 cm   Left common iliac artery: 1.6 x 1.2 cm   IMPRESSION: Normal caliber abdominal aorta.  PAST GI PROCEDURES:   Colonoscopy 07/18/2017 by Dr. Hilarie Fredrickson:  - 5 year recall colonoscopy 1. Surgical [P], transverse, polyp (2) - SESSILE SERRATED POLYP (SIX FRAGMENTS). - NO DYSPLASIA OR MALIGNANCY. 2. Surgical [P], descending and sigmoid, polyp (2) - HYPERPLASTIC POLYP (TWO FRAGMENTS). - NO ADENOMATOUS CHANGE OR MALIGNANCY   Past Medical History:  Diagnosis Date   AAA (abdominal aortic aneurysm) (HCC)    3.2 cm 2021   Allergy  Cataract    right eye with lens implant    Diabetes mellitus without complication (HCC)    GERD (gastroesophageal reflux disease)    some   Glaucoma    left eye   Hyperlipidemia    Hypertension    Past Surgical History:  Procedure Laterality Date   CATARACT EXTRACTION Bilateral    with lens implant 11-03-2014   COLONOSCOPY  2004   Dr Collene Mares    TONSILLECTOMY AND ADENOIDECTOMY     age 43   Social History: He is married.  He has 2 sons and 1 daughter.  He is a retired [employer] social research officer.  He quit smoking cigarettes 56 years ago.  No alcohol use.  No drug use.   Family History: Father with hypertension. Mother with history of ovarian cancer. Brother with heart disease, CVA and DM.  No known family history of esophageal, gastric or colon cancer.  No Known Allergies    Outpatient Encounter Medications as of 10/21/2022  Medication Sig   aspirin 81 MG tablet Take 1 tablet (81 mg total) by mouth daily.   atorvastatin (LIPITOR) 10 MG tablet Take 1 tablet by mouth once daily with breakfast   benazepril (LOTENSIN) 10 MG tablet TAKE 1 TABLET BY MOUTH ONCE DAILY .  APPOINTMENT REQUIRED FOR FUTURE REFILLS   Blood Glucose Monitoring Suppl (ONE TOUCH ULTRA SYSTEM KIT) W/DEVICE KIT 1 kit by Does not apply route once.   COMBIGAN 0.2-0.5 % ophthalmic solution Apply 1 drop to eye 2 (two) times daily.   Fluticasone Propionate (FLONASE ALLERGY RELIEF NA) Place 2 sprays into the nose daily. Buying OTC   Lancets (ONETOUCH DELICA PLUS [id number]) MISC USE 1 LANCET TO CHECK GLUCOSE ONCE DAILY   latanoprost (XALATAN) 0.005 % ophthalmic solution Place 1 drop into the left eye at bedtime.   LUMIGAN 0.01 % SOLN SMARTSIG:In Eye(s)   metFORMIN (GLUCOPHAGE) 500 MG tablet Take 1 tablet by mouth twice daily   Multiple Vitamins-Minerals (MULTIVITAMIN PO) Take by mouth.   ONETOUCH ULTRA test strip USE 1 STRIP TO CHECK GLUCOSE ONCE EACH DAY FASTING   triamterene-hydrochlorothiazide (MAXZIDE-25) 37.5-25 MG tablet Take 1 tablet by mouth daily.   Facility-Administered Encounter Medications as of 10/21/2022  Medication   0.9 %  sodium chloride infusion    REVIEW OF SYSTEMS:  Gen: Denies fever, sweats or chills. No weight loss.  CV: Denies chest pain, palpitations or edema. Resp: Denies cough, shortness of breath of hemoptysis.  GI: See HPI. GU : Denies urinary burning, blood in urine, increased urinary frequency or incontinence. MS: Denies joint pain, muscles aches or weakness. Derm: Denies rash, itchiness, skin lesions or unhealing ulcers. Psych: Denies depression, anxiety, memory loss or confusion. Heme: Denies bruising, easy bleeding. Neuro:  Denies headaches, dizziness or paresthesias. Endo:  + DM II  PHYSICAL EXAM:  BP 138/74   Pulse 67   Ht '5\' 11"'$  (1.803 m)   Wt 173 lb (78.5 kg)   BMI 24.13 kg/m   General: 82 year old male in no acute distress. Head: Normocephalic and atraumatic. Eyes:  Sclerae non-icteric, conjunctive pink. Ears: Normal auditory acuity. Mouth: Mostly absent dentition.  No ulcers or lesions.  Neck: Supple, no lymphadenopathy or thyromegaly.   Lungs: Clear bilaterally to auscultation without wheezes, crackles or rhonchi. Heart: Regular rate and rhythm. No murmur, rub or gallop appreciated.  Abdomen: Soft, protuberant, nontender, nondistended. No masses. No hepatosplenomegaly. Normoactive bowel sounds x 4 quadrants.  Rectal: Deferred.  Musculoskeletal: Symmetrical with no gross deformities. Skin: Warm and dry.  No rash or lesions on visible extremities. Extremities: No edema. Neurological: Alert oriented x 4, no focal deficits.  Psychological:  Alert and cooperative. Normal mood and affect.  ASSESSMENT AND PLAN:  74) 82 year old male with a history of 2 sessile serrated and 2 hyperplastic polyps removed from the colon per colonoscopy 07/2017.  -Colonoscopy benefits and risks discussed including risk with sedation, risk of bleeding, perforation and infection  -Further recommendations to be determined after colonoscopy completed  2) Infrequent GERD symptoms  -Avoid spicy foods -OTC antiacid as needed -Patient to contact office if symptoms increase  3) DM type II       CC:  Susy Frizzle, MD

## 2022-10-31 ENCOUNTER — Other Ambulatory Visit: Payer: Self-pay | Admitting: Family Medicine

## 2022-11-01 NOTE — Telephone Encounter (Signed)
Requested medication (s) are due for refill today:yes  Requested medication (s) are on the active medication list: yes  Last refill:  04/14/22  Future visit scheduled: yes  Notes to clinic:  Unable to refill per protocol, courtesy refill already given, routing for provider approval.      Requested Prescriptions  Pending Prescriptions Disp Refills   benazepril (LOTENSIN) 10 MG tablet [Pharmacy Med Name: Benazepril HCl 10 MG Oral Tablet] 90 tablet 0    Sig: TAKE 1 TABLET BY MOUTH ONCE DAILY . APPOINTMENT REQUIRED FOR FUTURE REFILLS     Cardiovascular:  ACE Inhibitors Failed - 10/31/2022  4:49 PM      Failed - Cr in normal range and within 180 days    Creat  Date Value Ref Range Status  02/01/2022 1.20 0.70 - 1.22 mg/dL Final         Failed - K in normal range and within 180 days    Potassium  Date Value Ref Range Status  02/01/2022 4.7 3.5 - 5.3 mmol/L Final         Failed - Valid encounter within last 6 months    Recent Outpatient Visits           9 months ago Essential hypertension   [place], Warren T, MD   2 years ago Diabetes mellitus without complication ([place])   [place] Susy Frizzle, MD   2 years ago Diabetes mellitus without complication ([place])   [place] Susy Frizzle, MD   3 years ago Diabetes mellitus without complication [facility])   [place] Pickard, Cammie Mcgee, MD   4 years ago Essential hypertension   [place], Cammie Mcgee, MD              Passed - Patient is not pregnant      Passed - Last BP in normal range    BP Readings from Last 1 Encounters:  10/21/22 138/74

## 2022-11-22 DIAGNOSIS — H401131 Primary open-angle glaucoma, bilateral, mild stage: Secondary | ICD-10-CM | POA: Diagnosis not present

## 2022-11-22 DIAGNOSIS — H04123 Dry eye syndrome of bilateral lacrimal glands: Secondary | ICD-10-CM | POA: Diagnosis not present

## 2022-11-22 DIAGNOSIS — Z961 Presence of intraocular lens: Secondary | ICD-10-CM | POA: Diagnosis not present

## 2022-11-26 IMAGING — US US AORTA
1 series · 14 of 25 positions shown · non-contrast
Comparison: None Available.

CLINICAL DATA: Abdominal aortic aneurysm screening.

EXAM:
ULTRASOUND OF ABDOMINAL AORTA
TECHNIQUE: Ultrasound examination of the abdominal aorta and proximal common
iliac arteries was performed to evaluate for aneurysm. Additional
color and Doppler images of the distal aorta were obtained to
document patency.

[Series 1: us aorta · 0.22mm/px · 14 of 27 slices shown]
[im 1/27]
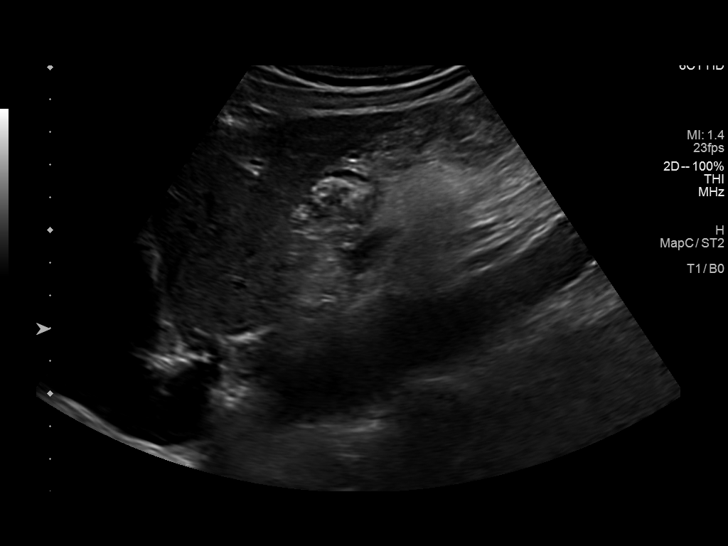
[im 3/27]
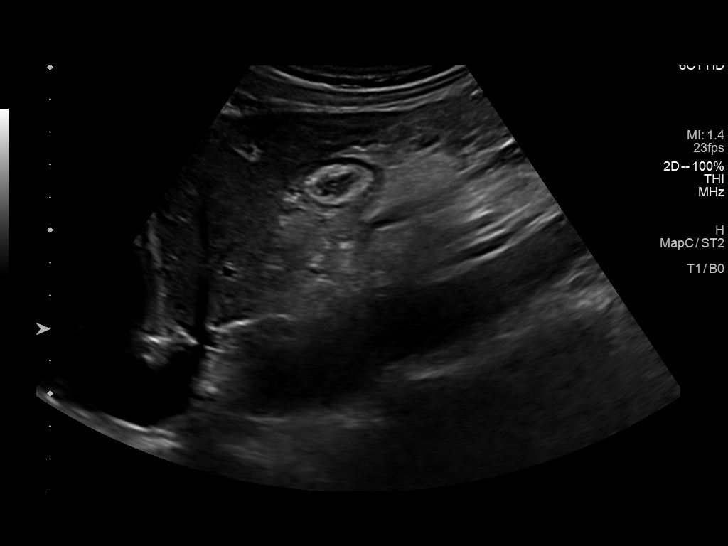
[im 5/27]
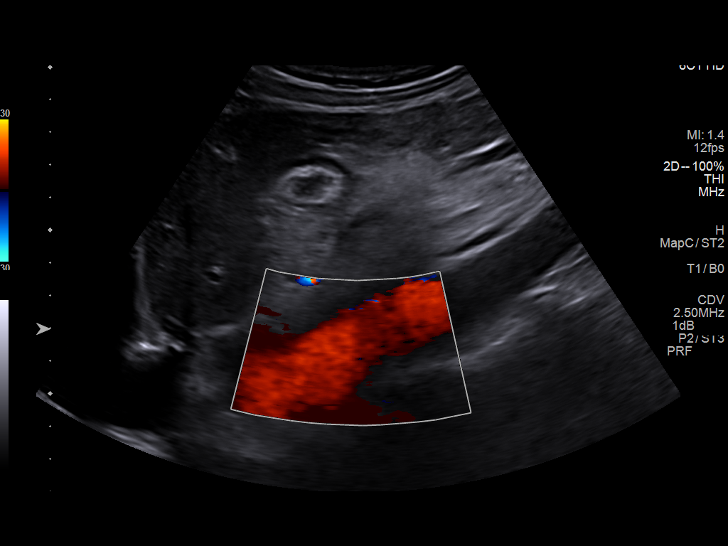
[im 7/27]
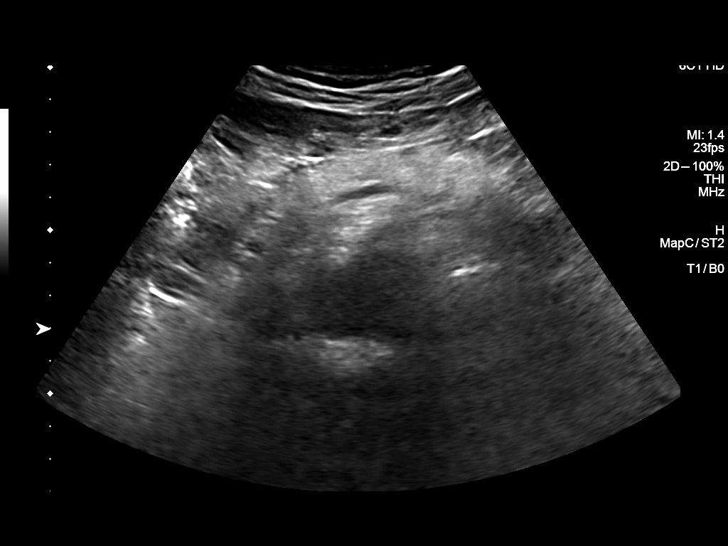
[im 9/27]
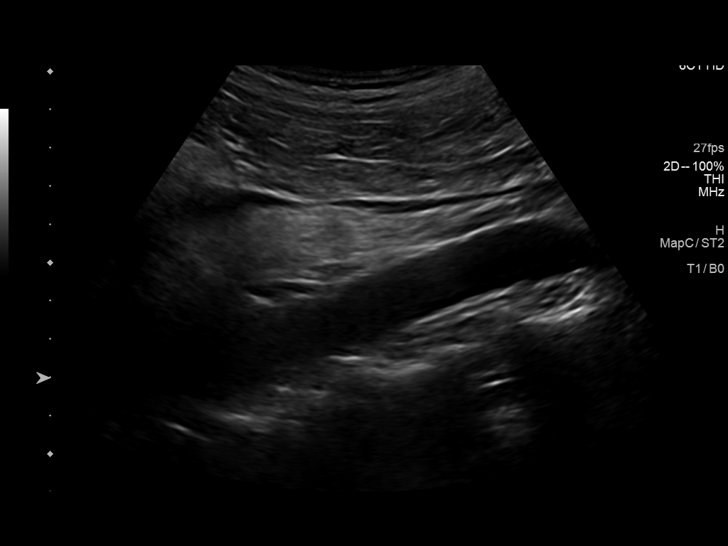
[im 10/27]
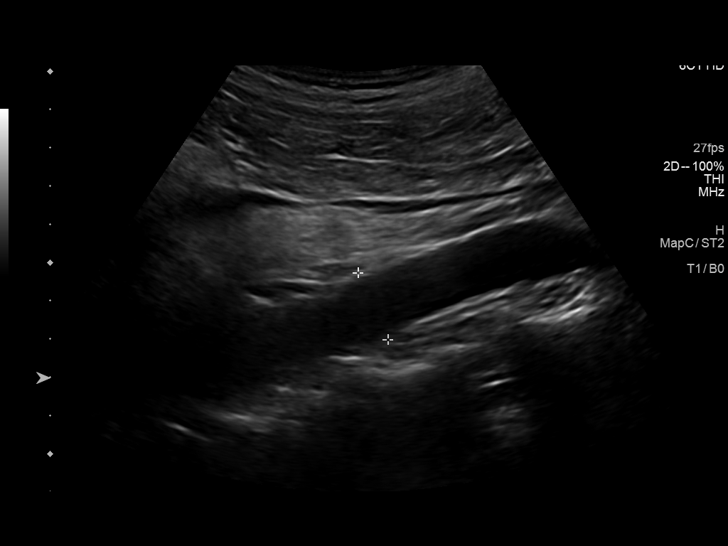
[im 12/27]
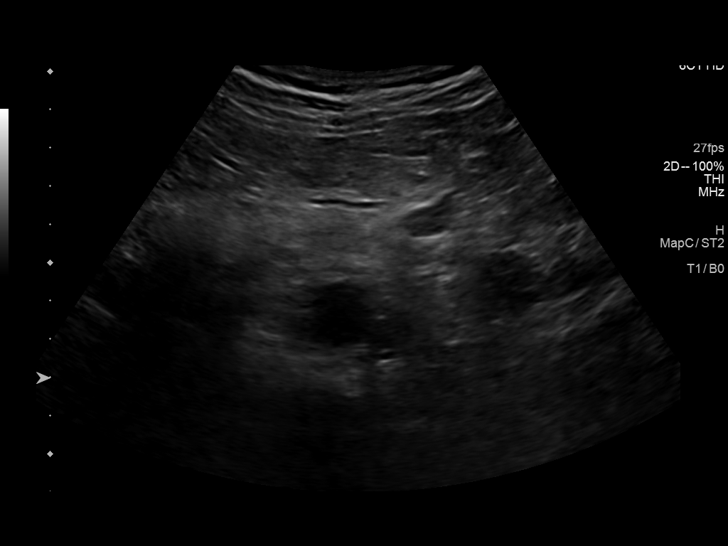
[im 15/27]
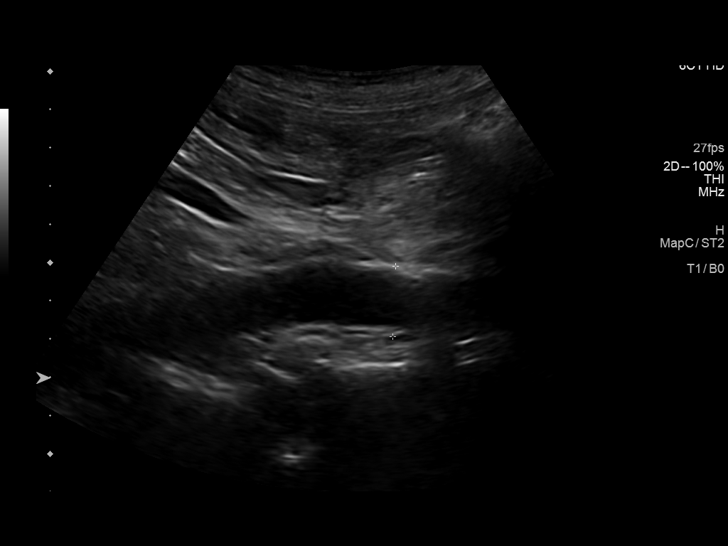
[im 17/27]
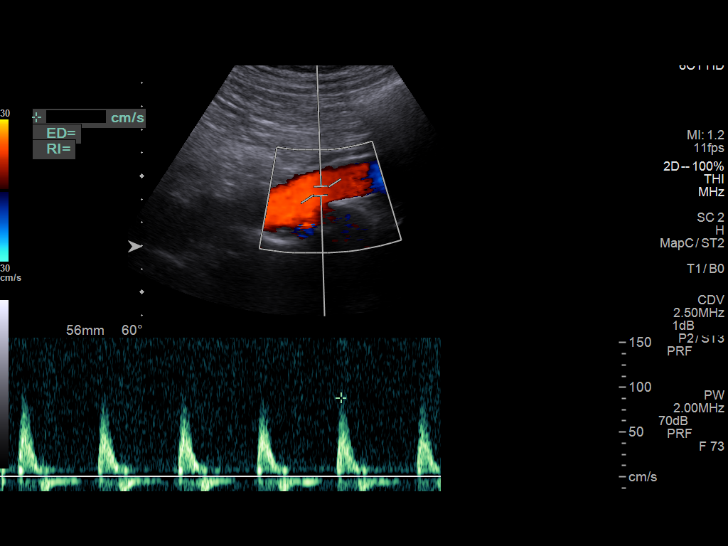
[im 18/27]
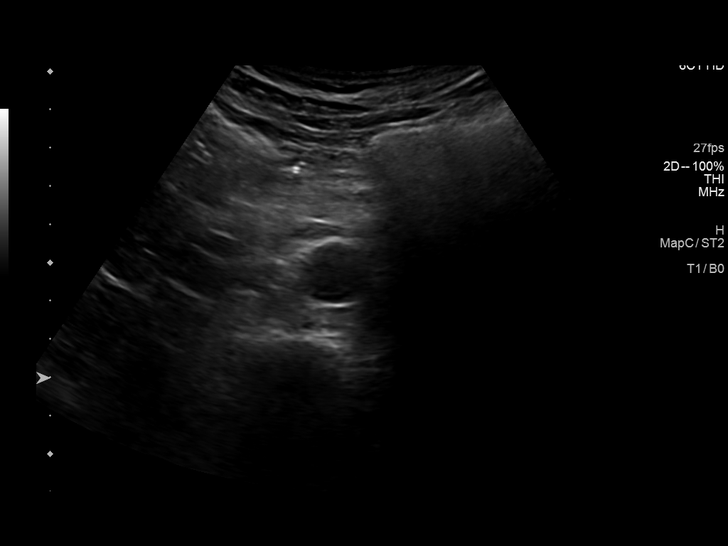
[im 20/27]
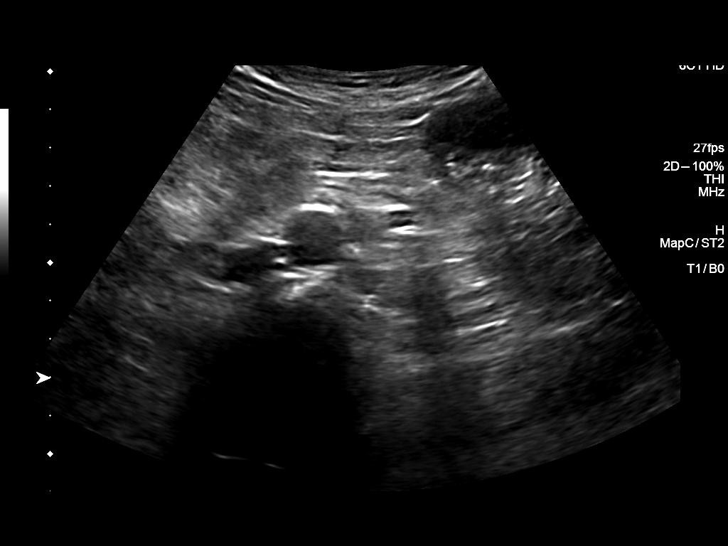
[im 22/27]
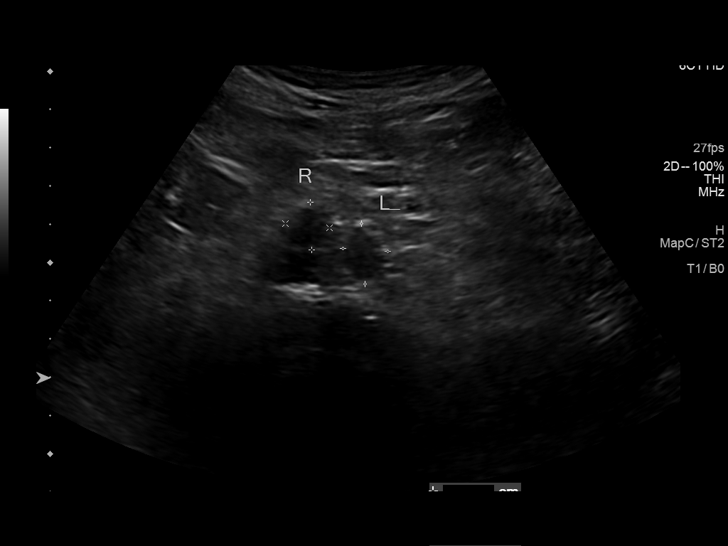
[im 24/27]
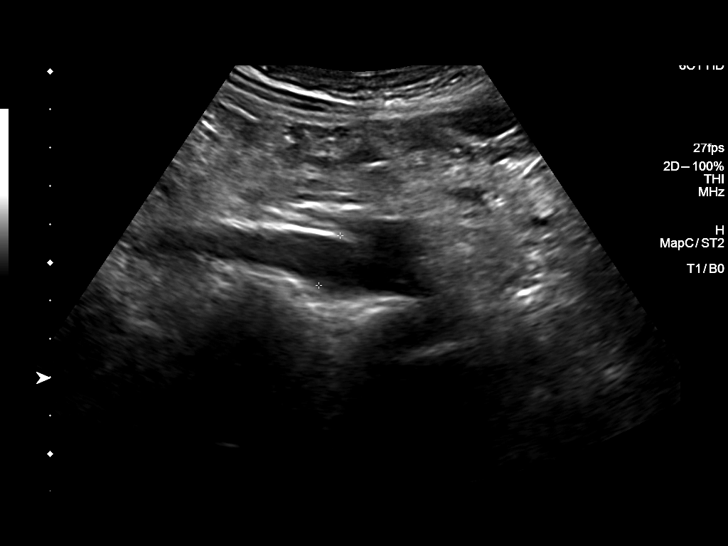
[im 27/27]
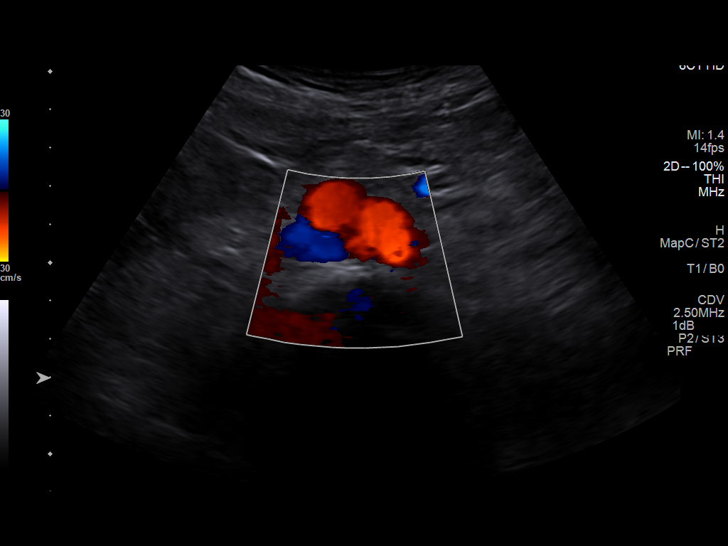

[14 of 25 positions shown; findings below may reference images not displayed]

FINDINGS: Abdominal aortic measurements as follows: AP x TRV

Proximal:  2.8 x 2.7 cm

Mid:  2.0 x 2.1 cm

Distal:  1.9 x 1.9 cm
Patent: Yes, peak systolic velocity is 88 cm/s

Right common iliac artery: 1.3 x 1.2 cm

Left common iliac artery: 1.6 x 1.2 cm
IMPRESSION: Normal caliber abdominal aorta.

## 2022-11-28 ENCOUNTER — Encounter: Payer: Self-pay | Admitting: Internal Medicine

## 2022-11-28 ENCOUNTER — Ambulatory Visit (AMBULATORY_SURGERY_CENTER): Payer: Medicare HMO | Admitting: Internal Medicine

## 2022-11-28 VITALS — BP 123/75 | HR 83 | Temp 96.2°F | Resp 16 | Ht 71.0 in | Wt 173.0 lb

## 2022-11-28 DIAGNOSIS — D123 Benign neoplasm of transverse colon: Secondary | ICD-10-CM | POA: Diagnosis not present

## 2022-11-28 DIAGNOSIS — E119 Type 2 diabetes mellitus without complications: Secondary | ICD-10-CM | POA: Diagnosis not present

## 2022-11-28 DIAGNOSIS — K621 Rectal polyp: Secondary | ICD-10-CM | POA: Diagnosis not present

## 2022-11-28 DIAGNOSIS — D122 Benign neoplasm of ascending colon: Secondary | ICD-10-CM | POA: Diagnosis not present

## 2022-11-28 DIAGNOSIS — Z86010 Personal history of colonic polyps: Secondary | ICD-10-CM | POA: Diagnosis not present

## 2022-11-28 DIAGNOSIS — D128 Benign neoplasm of rectum: Secondary | ICD-10-CM | POA: Diagnosis not present

## 2022-11-28 DIAGNOSIS — I10 Essential (primary) hypertension: Secondary | ICD-10-CM | POA: Diagnosis not present

## 2022-11-28 DIAGNOSIS — Z09 Encounter for follow-up examination after completed treatment for conditions other than malignant neoplasm: Secondary | ICD-10-CM | POA: Diagnosis not present

## 2022-11-28 DIAGNOSIS — Z1211 Encounter for screening for malignant neoplasm of colon: Secondary | ICD-10-CM

## 2022-11-28 DIAGNOSIS — E785 Hyperlipidemia, unspecified: Secondary | ICD-10-CM | POA: Diagnosis not present

## 2022-11-28 MED ORDER — SODIUM CHLORIDE 0.9 % IV SOLN: 500.0000 mL | Freq: Once | INTRAVENOUS | Status: DC

## 2022-11-28 NOTE — Progress Notes (Unsigned)
Vssmnad trans to pacu

## 2022-11-28 NOTE — Op Note (Signed)
[place] Patient Name: Aaron Mcfarland Procedure Date: 11/28/2022 2:18 PM MRN: [id number] Endoscopist: Jerene Bears , MD, [id number] Age: 82 Referring MD:  Date of Birth: 24-Jun-1941 Gender: Male Account #: [id number] Procedure:                Colonoscopy Indications:              High risk colon cancer surveillance: Personal                            history of sessile serrated colon polyps (less than                            10 mm in size) with no dysplasia, Last colonoscopy:                            October 2018 Medicines:                Monitored Anesthesia Care Procedure:                Pre-Anesthesia Assessment:                           - Prior to the procedure, a History and Physical                            was performed, and patient medications and                            allergies were reviewed. The patient's tolerance of                            previous anesthesia was also reviewed. The risks                            and benefits of the procedure and the sedation                            options and risks were discussed with the patient.                            All questions were answered, and informed consent                            was obtained. Prior Anticoagulants: The patient has                            taken no anticoagulant or antiplatelet agents. ASA                            Grade Assessment: III - A patient with severe                            systemic disease. After reviewing the risks and  benefits, the patient was deemed in satisfactory                            condition to undergo the procedure.                           After obtaining informed consent, the colonoscope                            was passed under direct vision. Throughout the                            procedure, the patient's blood pressure, pulse, and                            oxygen saturations were monitored  continuously. The                            Olympus [id number]-H190DL [id number]) Colonoscope was                            introduced through the anus and advanced to the                            cecum, identified by appendiceal orifice and                            ileocecal valve. The colonoscopy was performed                            without difficulty. The patient tolerated the                            procedure well. The quality of the bowel                            preparation was good. The ileocecal valve,                            appendiceal orifice, and rectum were photographed. Scope In: 2:26:03 PM Scope Out: 2:43:34 PM Scope Withdrawal Time: 0 hours 12 minutes 56 seconds  Total Procedure Duration: 0 hours 17 minutes 31 seconds  Findings:                 The digital rectal exam was normal.                           Two sessile polyps were found in the ascending                            colon. The polyps were 2 to 4 mm in size. These                            polyps were removed with a cold snare. Resection  and retrieval were complete.                           A 4 mm polyp was found in the transverse colon. The                            polyp was sessile. The polyp was removed with a                            cold snare. Resection and retrieval were complete.                           Three sessile polyps were found in the rectum. The                            polyps were 3 to 5 mm in size. These polyps were                            removed with a cold snare. Resection and retrieval                            were complete.                           Multiple large-mouthed, medium-mouthed and                            small-mouthed diverticula were found in the sigmoid                            colon, descending colon and transverse colon.                           Internal hemorrhoids were found during                             retroflexion. The hemorrhoids were large. Complications:            No immediate complications. Estimated Blood Loss:     Estimated blood loss was minimal. Impression:               - Two 2 to 4 mm polyps in the ascending colon,                            removed with a cold snare. Resected and retrieved.                           - One 4 mm polyp in the transverse colon, removed                            with a cold snare. Resected and retrieved.                           - Three 3 to 5 mm polyps  in the rectum, removed                            with a cold snare. Resected and retrieved.                           - Severe diverticulosis in the sigmoid colon, in                            the descending colon and in the transverse colon.                           - Internal hemorrhoids. Recommendation:           - Patient has a contact number available for                            emergencies. The signs and symptoms of potential                            delayed complications were discussed with the                            patient. Return to normal activities tomorrow.                            Written discharge instructions were provided to the                            patient.                           - Resume previous diet.                           - Continue present medications.                           - Await pathology results.                           - No recommendation at this time regarding repeat                            colonoscopy due to age. Jerene Bears, MD 11/28/2022 2:45:57 PM This report has been signed electronically.

## 2022-11-28 NOTE — Progress Notes (Unsigned)
GASTROENTEROLOGY PROCEDURE H&P NOTE   Primary Care Physician: Susy Frizzle, MD    Reason for Procedure:  History of colon polyps  Plan:    Colonoscopy  Patient is appropriate for endoscopic procedure(s) in the ambulatory ([place]) setting.  The nature of the procedure, as well as the risks, benefits, and alternatives were carefully and thoroughly reviewed with the patient. Ample time for discussion and questions allowed. The patient understood, was satisfied, and agreed to proceed.     HPI: Aaron Mcfarland is a 82 y.o. male who presents for surveillance colonoscopy.  Medical history as below.  Tolerated the prep.  No recent chest pain or shortness of breath.  No abdominal pain today.  Past Medical History:  Diagnosis Date   AAA (abdominal aortic aneurysm) (HCC)    3.2 cm 2021   Allergy    Cataract    right eye with lens implant    Diabetes mellitus without complication (HCC)    GERD (gastroesophageal reflux disease)    some   Glaucoma    left eye   Hyperlipidemia    Hypertension     Past Surgical History:  Procedure Laterality Date   CATARACT EXTRACTION Bilateral    with lens implant 11-03-2014   COLONOSCOPY  2004   Dr Collene Mares    TONSILLECTOMY AND ADENOIDECTOMY     age 10    Prior to Admission medications   Medication Sig Start Date End Date Taking? Authorizing Provider  aspirin 81 MG tablet Take 1 tablet (81 mg total) by mouth daily. 01/27/14  Yes Dena Billet B, PA-C  atorvastatin (LIPITOR) 10 MG tablet Take 1 tablet by mouth once daily with breakfast 03/11/22  Yes Pickard, Cammie Mcgee, MD  benazepril (LOTENSIN) 10 MG tablet TAKE 1 TABLET BY MOUTH ONCE DAILY . APPOINTMENT REQUIRED FOR FUTURE REFILLS 04/14/22  Yes Susy Frizzle, MD  Blood Glucose Monitoring Suppl (ONE TOUCH ULTRA SYSTEM KIT) W/DEVICE KIT 1 kit by Does not apply route once. 06/01/15  Yes Dixon, Mary B, PA-C  COMBIGAN 0.2-0.5 % ophthalmic solution Apply 1 drop to eye 2 (two) times daily. 01/14/21   Yes [provider]  Lancets (ONETOUCH DELICA PLUS 123XX123) [place] USE 1 LANCET TO CHECK GLUCOSE ONCE DAILY 08/08/22  Yes Susy Frizzle, MD  latanoprost (XALATAN) 0.005 % ophthalmic solution Place 1 drop into the left eye at bedtime. 02/04/14  Yes [provider]  LUMIGAN 0.01 % SOLN SMARTSIG:In Eye(s) 01/04/22  Yes [provider]  metFORMIN (GLUCOPHAGE) 500 MG tablet Take 1 tablet by mouth twice daily 09/12/22  Yes Pickard, Cammie Mcgee, MD  Multiple Vitamins-Minerals (MULTIVITAMIN PO) Take by mouth.   Yes [provider]  [facility] ULTRA test strip USE 1 STRIP TO [place] 08/08/22  Yes Susy Frizzle, MD  triamterene-hydrochlorothiazide (MAXZIDE-25) 37.5-25 MG tablet Take 1 tablet by mouth daily. 02/01/22  Yes Susy Frizzle, MD  Fluticasone Propionate [facility] ALLERGY RELIEF NA) Place 2 sprays into the nose daily. Buying OTC    [provider]  [facility] syringe  07/19/22   [provider]    Current Outpatient Medications  Medication Sig Dispense Refill   aspirin 81 MG tablet Take 1 tablet (81 mg total) by mouth daily. 30 tablet 11   atorvastatin (LIPITOR) 10 MG tablet Take 1 tablet by mouth once daily with breakfast 90 tablet 3   benazepril (LOTENSIN) 10 MG tablet TAKE 1 TABLET BY MOUTH ONCE DAILY . APPOINTMENT  REQUIRED FOR FUTURE REFILLS 90 tablet 1   Blood Glucose Monitoring Suppl (ONE TOUCH ULTRA SYSTEM KIT) W/DEVICE KIT 1 kit by Does not apply route once. 1 each 0   COMBIGAN 0.2-0.5 % ophthalmic solution Apply 1 drop to eye 2 (two) times daily.     Lancets (ONETOUCH DELICA PLUS 123XX123) [place] USE 1 LANCET TO CHECK GLUCOSE ONCE DAILY 100 each 0   latanoprost (XALATAN) 0.005 % ophthalmic solution Place 1 drop into the left eye at bedtime.     LUMIGAN 0.01 % SOLN SMARTSIG:In Eye(s)     metFORMIN (GLUCOPHAGE) 500 MG tablet Take 1 tablet by mouth twice daily 180 tablet 0   Multiple Vitamins-Minerals  (MULTIVITAMIN PO) Take by mouth.     ONETOUCH ULTRA test strip USE 1 STRIP TO CHECK GLUCOSE ONCE EACH DAY FASTING 100 each 0   triamterene-hydrochlorothiazide (MAXZIDE-25) 37.5-25 MG tablet Take 1 tablet by mouth daily. 90 tablet 3   Fluticasone Propionate (FLONASE ALLERGY RELIEF NA) Place 2 sprays into the nose daily. Buying OTC     SPIKEVAX syringe      Current Facility-Administered Medications  Medication Dose Route Frequency Provider Last Rate Last Admin   0.9 %  sodium chloride infusion  500 mL Intravenous Continuous Aram Domzalski, Lajuan Lines, MD       0.9 %  sodium chloride infusion  500 mL Intravenous Once Elva Mauro, Lajuan Lines, MD        Allergies as of 11/28/2022   (No Known Allergies)    Family History  Problem Relation Age of Onset   Cancer Mother        ovarian   Ovarian cancer Mother    Hypertension Father    Stroke Father    Diabetes Brother    Heart disease Brother    Stroke Brother    Colon cancer Neg Hx    Colon polyps Neg Hx    Esophageal cancer Neg Hx    Rectal cancer Neg Hx    Stomach cancer Neg Hx     Social History   Socioeconomic History   Marital status: Married    Spouse name: Not on file   Number of children: Not on file   Years of education: Not on file   Highest education level: Not on file  Occupational History   Not on file  Tobacco Use   Smoking status: Former    Types: Cigarettes    Quit date: 04/24/1966    Years since quitting: 56.6   Smokeless tobacco: Never  Substance and Sexual Activity   Alcohol use: Yes   Drug use: No   Sexual activity: Yes    Birth control/protection: None  Other Topics Concern   Not on file  Social History Narrative   Not on file   Social Determinants of Health   Financial Resource Strain: Low Risk  (08/03/2022)   Overall Financial Resource Strain (CARDIA)    Difficulty of Paying Living Expenses: Not hard at all  Food Insecurity: No Food Insecurity (08/03/2022)   Hunger Vital Sign    Worried About Running Out of Food  in the Last Year: Never true    Ran Out of Food in the Last Year: Never true  Transportation Needs: No Transportation Needs (08/03/2022)   PRAPARE - Hydrologist (Medical): No    Lack of Transportation (Non-Medical): No  Physical Activity: Insufficiently Active (08/03/2022)   Exercise Vital Sign    Days of Exercise per Week: 3 days  Minutes of Exercise per Session: 30 min  Stress: No Stress Concern Present (08/03/2022)   [place]    Feeling of Stress : Not at all  Social Connections: Moderately Isolated (08/03/2022)   Social Connection and Isolation Panel [NHANES]    Frequency of Communication with Friends and Family: Three times a week    Frequency of Social Gatherings with Friends and Family: Three times a week    Attends Religious Services: Never    Active Member of Clubs or Organizations: No    Attends Archivist Meetings: Never    Marital Status: Married  Human resources officer Violence: Not At Risk (08/03/2022)   Humiliation, Afraid, Rape, and Kick questionnaire    Fear of Current or Ex-Partner: No    Emotionally Abused: No    Physically Abused: No    Sexually Abused: No    Physical Exam: Vital signs in last 24 hours: '@BP'$  124/81   Pulse (!) 108   Temp (!) 96.2 F (35.7 C)   Ht '5\' 11"'$  (1.803 m)   Wt 173 lb (78.5 kg)   SpO2 100%   BMI 24.13 kg/m  GEN: NAD EYE: Sclerae anicteric ENT: MMM CV: Non-tachycardic Pulm: CTA b/l GI: Soft, NT/ND NEURO:  Alert & Oriented x 3   Zenovia Jarred, MD [facility]  11/28/2022 2:13 PM

## 2022-11-28 NOTE — Progress Notes (Signed)
Pt's states no medical or surgical changes since previsit or office visit. 

## 2022-11-28 NOTE — Patient Instructions (Signed)
HANDOUTS PROVIDED ON: POLYPS, DIVERTICULITIS, & HEMORRHOIDS  The polyps removed today have been sent for pathology.  The results can take 1-3 weeks to receive.      You may resume your previous diet and medication schedule.  Thank you for allowing [place] to care for you today!!!   YOU HAD AN ENDOSCOPIC PROCEDURE TODAY AT [place]:   Refer to the procedure report that was given to you for any specific questions about what was found during the examination.  If the procedure report does not answer your questions, please call your gastroenterologist to clarify.  If you requested that your care partner not be given the details of your procedure findings, then the procedure report has been included in a sealed envelope for you to review at your convenience later.  YOU SHOULD EXPECT: Some feelings of bloating in the abdomen. Passage of more gas than usual.  Walking can help get rid of the air that was put into your GI tract during the procedure and reduce the bloating. If you had a lower endoscopy (such as a colonoscopy or flexible sigmoidoscopy) you may notice spotting of blood in your stool or on the toilet paper. If you underwent a bowel prep for your procedure, you may not have a normal bowel movement for a few days.  Please Note:  You might notice some irritation and congestion in your nose or some drainage.  This is from the oxygen used during your procedure.  There is no need for concern and it should clear up in a day or so.  SYMPTOMS TO REPORT IMMEDIATELY:  Following lower endoscopy (colonoscopy or flexible sigmoidoscopy):  Excessive amounts of blood in the stool  Significant tenderness or worsening of abdominal pains  Swelling of the abdomen that is new, acute  Fever of 100F or higher  For urgent or emergent issues, a gastroenterologist can be reached at any hour by calling [phone]. Do not use MyChart messaging for urgent concerns.    DIET:  We do recommend a  small meal at first, but then you may proceed to your regular diet.  Drink plenty of fluids but you should avoid alcoholic beverages for 24 hours.  ACTIVITY:  You should plan to take it easy for the rest of today and you should NOT DRIVE or use heavy machinery until tomorrow (because of the sedation medicines used during the test).    FOLLOW UP: Our staff will call the number listed on your records the next business day following your procedure.  We will call around 7:15- 8:00 am to check on you and address any questions or concerns that you may have regarding the information given to you following your procedure. If we do not reach you, we will leave a message.     If any biopsies were taken you will be contacted by phone or by letter within the next 1-3 weeks.  Please call us at [phone] if you have not heard about the biopsies in 3 weeks.    SIGNATURES/CONFIDENTIALITY: You and/or your care partner have signed paperwork which will be entered into your electronic medical record.  These signatures attest to the fact that that the information above on your After Visit Summary has been reviewed and is understood.  Full responsibility of the confidentiality of this discharge information lies with you and/or your care-partner.

## 2022-11-29 ENCOUNTER — Telehealth: Payer: Self-pay | Admitting: *Deleted

## 2022-11-29 NOTE — Telephone Encounter (Signed)
  Follow up Call-     11/28/2022    1:21 PM  Call back number  Post procedure Call Back phone  # [phone]  Permission to leave phone message Yes     Patient questions:  Do you have a fever, pain , or abdominal swelling? No. Pain Score  0 *  Have you tolerated food without any problems? Yes.    Have you been able to return to your normal activities? Yes.    Do you have any questions about your discharge instructions: Diet   No. Medications  No. Follow up visit  No.  Do you have questions or concerns about your Care? No.  Actions: * If pain score is 4 or above: No action needed, pain <4.

## 2022-12-01 ENCOUNTER — Encounter: Payer: Self-pay | Admitting: Internal Medicine

## 2022-12-07 ENCOUNTER — Other Ambulatory Visit: Payer: Self-pay | Admitting: Family Medicine

## 2022-12-07 NOTE — Telephone Encounter (Signed)
Requested Prescriptions  Pending Prescriptions Disp Refills   ONETOUCH ULTRA test strip [Pharmacy Med Name: OneTouch Ultra Blue In Vitro Strip] 100 each 0    Sig: USE 1 STRIP TO CHECK GLUCOSE ONCE EACH DAY FASTING.     Endocrinology: Diabetes - Testing Supplies Passed - 12/07/2022 11:04 AM      Passed - Valid encounter within last 12 months    Recent Outpatient Visits           10 months ago Essential hypertension   [place], Warren T, MD   2 years ago Diabetes mellitus without complication ([place])   [place] Susy Frizzle, MD   2 years ago Diabetes mellitus without complication ([place])   [place] Susy Frizzle, MD   3 years ago Diabetes mellitus without complication ([place])   [place] Pickard, Cammie Mcgee, MD   4 years ago Essential hypertension   [place] Pickard, Cammie Mcgee, MD               Lancets (ONETOUCH DELICA PLUS 123XX123) [place] [Pharmacy Med Name: Jonetta Speak LAN [id number] MIS] [id number] each 0    Sig: USE 1 LANCET TO CHECK GLUCOSE ONCE DAILY     Endocrinology: Diabetes - Testing Supplies Passed - 12/07/2022 11:04 AM      Passed - Valid encounter within last 12 months    Recent Outpatient Visits           10 months ago Essential hypertension   [place], Warren T, MD   2 years ago Diabetes mellitus without complication [facility])   [facility] Susy Frizzle, MD   2 years ago Diabetes mellitus without complication [facility])   [place] Susy Frizzle, MD   3 years ago Diabetes mellitus without complication ([place])   [place] Pickard, Cammie Mcgee, MD   4 years ago Essential hypertension   [place] Pickard, Cammie Mcgee, MD

## 2022-12-13 ENCOUNTER — Other Ambulatory Visit: Payer: Self-pay | Admitting: Family Medicine

## 2022-12-27 ENCOUNTER — Other Ambulatory Visit: Payer: Self-pay | Admitting: Family Medicine

## 2023-02-07 ENCOUNTER — Other Ambulatory Visit: Payer: Self-pay | Admitting: Family Medicine

## 2023-02-07 DIAGNOSIS — I10 Essential (primary) hypertension: Secondary | ICD-10-CM

## 2023-02-22 DIAGNOSIS — Z961 Presence of intraocular lens: Secondary | ICD-10-CM | POA: Diagnosis not present

## 2023-02-22 DIAGNOSIS — H04123 Dry eye syndrome of bilateral lacrimal glands: Secondary | ICD-10-CM | POA: Diagnosis not present

## 2023-02-22 DIAGNOSIS — H401131 Primary open-angle glaucoma, bilateral, mild stage: Secondary | ICD-10-CM | POA: Diagnosis not present

## 2023-04-17 ENCOUNTER — Encounter: Payer: Self-pay | Admitting: Family Medicine

## 2023-04-17 ENCOUNTER — Ambulatory Visit (INDEPENDENT_AMBULATORY_CARE_PROVIDER_SITE_OTHER): Payer: Medicare HMO | Admitting: Family Medicine

## 2023-04-17 VITALS — BP 122/64 | HR 65 | Temp 97.7°F | Ht 71.0 in | Wt 169.0 lb

## 2023-04-17 DIAGNOSIS — E119 Type 2 diabetes mellitus without complications: Secondary | ICD-10-CM

## 2023-04-17 NOTE — Progress Notes (Signed)
Subjective:    Patient ID: Aaron Mcfarland, male    DOB: 1940-11-08, 82 y.o.   MRN: [id number]  Medication Refill   Patient is a 82 year old African-American gentleman with a history of diabetes, hypertension, hyperlipidemia.  He states that recently his fasting blood sugars have been elevated.  Previously they were around 100-130.  Last year his A1c was 7.1.  Now he states that his fasting blood sugars are over 160.  He is not checking them later in the day.  He denies any hypoglycemic episodes.  He is taking metformin 500 mg twice a day.  He denies any stomach upset or diarrhea.  He denies any chest pain shortness of breath or dyspnea on exertion. Past Medical History:  Diagnosis Date   AAA (abdominal aortic aneurysm) (HCC)    3.2 cm 2021   Allergy    Cataract    right eye with lens implant    Diabetes mellitus without complication (HCC)    GERD (gastroesophageal reflux disease)    some   Glaucoma    left eye   Hyperlipidemia    Hypertension    Past Surgical History:  Procedure Laterality Date   CATARACT EXTRACTION Bilateral    with lens implant 11-03-2014   COLONOSCOPY  2004   Dr Loreta Ave    TONSILLECTOMY AND ADENOIDECTOMY     age 89   Current Outpatient Medications on File Prior to Visit  Medication Sig Dispense Refill   aspirin 81 MG tablet Take 1 tablet (81 mg total) by mouth daily. 30 tablet 11   atorvastatin (LIPITOR) 10 MG tablet Take 1 tablet by mouth once daily with breakfast 90 tablet 3   benazepril (LOTENSIN) 10 MG tablet TAKE 1 TABLET BY MOUTH ONCE DAILY . APPOINTMENT REQUIRED FOR FUTURE REFILLS 90 tablet 1   Blood Glucose Monitoring Suppl (ONE TOUCH ULTRA SYSTEM KIT) W/DEVICE KIT 1 kit by Does not apply route once. 1 each 0   COMBIGAN 0.2-0.5 % ophthalmic solution Apply 1 drop to eye 2 (two) times daily.     Fluticasone Propionate (FLONASE ALLERGY RELIEF NA) Place 2 sprays into the nose daily. Buying OTC     Lancets (ONETOUCH DELICA PLUS LANCET33G) MISC USE 1  LANCET TO CHECK GLUCOSE ONCE DAILY 100 each 0   latanoprost (XALATAN) 0.005 % ophthalmic solution Place 1 drop into the left eye at bedtime.     LUMIGAN 0.01 % SOLN SMARTSIG:In Eye(s)     metFORMIN (GLUCOPHAGE) 500 MG tablet Take 1 tablet by mouth twice daily 180 tablet 0   Multiple Vitamins-Minerals (MULTIVITAMIN PO) Take by mouth.     ONETOUCH ULTRA test strip USE 1 STRIP TO CHECK GLUCOSE ONCE EACH DAY FASTING. 100 each 0   triamterene-hydrochlorothiazide (MAXZIDE-25) 37.5-25 MG tablet Take 1 tablet by mouth daily. 60 tablet 0   SPIKEVAX syringe  (Patient not taking: Reported on 04/17/2023)     No current facility-administered medications on file prior to visit.   No Known Allergies Social History   Socioeconomic History   Marital status: Married    Spouse name: Not on file   Number of children: Not on file   Years of education: Not on file   Highest education level: Not on file  Occupational History   Not on file  Tobacco Use   Smoking status: Former    Current packs/day: 0.00    Types: Cigarettes    Quit date: 04/24/1966    Years since quitting: 57.0   Smokeless tobacco:  Never  Substance and Sexual Activity   Alcohol use: Yes   Drug use: No   Sexual activity: Yes    Birth control/protection: None  Other Topics Concern   Not on file  Social History Narrative   Not on file   Social Determinants of Health   Financial Resource Strain: Low Risk  (08/03/2022)   Overall Financial Resource Strain (CARDIA)    Difficulty of Paying Living Expenses: Not hard at all  Food Insecurity: No Food Insecurity (08/03/2022)   Hunger Vital Sign    Worried About Running Out of Food in the Last Year: Never true    Ran Out of Food in the Last Year: Never true  Transportation Needs: No Transportation Needs (08/03/2022)   PRAPARE - Administrator, Civil Service (Medical): No    Lack of Transportation (Non-Medical): No  Physical Activity: Insufficiently Active (08/03/2022)   Exercise  Vital Sign    Days of Exercise per Week: 3 days    Minutes of Exercise per Session: 30 min  Stress: No Stress Concern Present (08/03/2022)   [employer] of Occupational Health - Occupational Stress Questionnaire    Feeling of Stress : Not at all  Social Connections: Moderately Isolated (08/03/2022)   Social Connection and Isolation Panel [NHANES]    Frequency of Communication with Friends and Family: Three times a week    Frequency of Social Gatherings with Friends and Family: Three times a week    Attends Religious Services: Never    Active Member of Clubs or Organizations: No    Attends Banker Meetings: Never    Marital Status: Married  Catering manager Violence: Not At Risk (08/03/2022)   Humiliation, Afraid, Rape, and Kick questionnaire    Fear of Current or Ex-Partner: No    Emotionally Abused: No    Physically Abused: No    Sexually Abused: No      Review of Systems  All other systems reviewed and are negative.      Objective:   Physical Exam Vitals reviewed.  Constitutional:      General: He is not in acute distress.    Appearance: Normal appearance. He is normal weight. He is not ill-appearing.  Neck:     Vascular: No carotid bruit.  Cardiovascular:     Rate and Rhythm: Normal rate and regular rhythm.     Pulses: Normal pulses.     Heart sounds: Normal heart sounds. No murmur heard.    No friction rub. No gallop.  Pulmonary:     Effort: Pulmonary effort is normal. No respiratory distress.     Breath sounds: Normal breath sounds. No stridor. No wheezing, rhonchi or rales.  Chest:     Chest wall: No tenderness.  Abdominal:     General: Bowel sounds are normal. There is no distension.     Palpations: Abdomen is soft. There is no mass.     Tenderness: There is no abdominal tenderness. There is no right CVA tenderness, left CVA tenderness, guarding or rebound.     Hernia: No hernia is present.  Musculoskeletal:     Right lower leg: No edema.      Left lower leg: No edema.  Lymphadenopathy:     Cervical: No cervical adenopathy.  Neurological:     Mental Status: He is alert.           Assessment & Plan:  Diabetes mellitus without complication (HCC) - Plan: Hemoglobin A1c, COMPLETE METABOLIC PANEL WITH GFR,  Lipid panel, Protein / Creatinine Ratio, Urine Blood pressure today is excellent.  However sugars sound elevated.  Obtain CBC, CMP, fasting lipid panel, A1c, and urine protein to creatinine ratio.  If hemoglobin A1c is greater than 7 I would increase metformin to 1000 mg twice daily adequately greater than 7 I would recommend adding Jardiance 25 mg daily.  Goal LDL cholesterol is less than 100.  Await to see fasting lipid panel

## 2023-04-21 ENCOUNTER — Other Ambulatory Visit: Payer: Self-pay | Admitting: Family Medicine

## 2023-04-27 ENCOUNTER — Other Ambulatory Visit: Payer: Medicare HMO

## 2023-04-27 DIAGNOSIS — E119 Type 2 diabetes mellitus without complications: Secondary | ICD-10-CM | POA: Diagnosis not present

## 2023-04-28 LAB — PROTEIN / CREATININE RATIO, URINE

## 2023-04-28 LAB — COMPLETE METABOLIC PANEL WITH GFR: eGFR: 65 mL/min/{1.73_m2} (ref >=60)

## 2023-05-09 ENCOUNTER — Other Ambulatory Visit: Payer: Self-pay | Admitting: Family Medicine

## 2023-05-09 DIAGNOSIS — I10 Essential (primary) hypertension: Secondary | ICD-10-CM

## 2023-05-10 NOTE — Telephone Encounter (Signed)
Last OV 04/17/23, within protocol.  Requested Prescriptions  Pending Prescriptions Disp Refills   triamterene-hydrochlorothiazide (MAXZIDE-25) 37.5-25 MG tablet [Pharmacy Med Name: Triamterene-HCTZ 37.5-25 MG Oral Tablet] 90 tablet 0    Sig: TAKE 1 TABLET BY MOUTH ONCE DAILY . APPOINTMENT REQUIRED FOR FUTURE REFILLS     Cardiovascular: Diuretic Combos Failed - 05/09/2023 10:48 AM      Failed - Valid encounter within last 6 months    Recent Outpatient Visits           1 year ago Essential hypertension   [facility] Tanya Nones, Priscille Heidelberg, MD   2 years ago Diabetes mellitus without complication (HCC)   [facility] Donita Brooks, MD   3 years ago Diabetes mellitus without complication (HCC)   [facility] Donita Brooks, MD   3 years ago Diabetes mellitus without complication (HCC)   [facility] Pickard, Priscille Heidelberg, MD   4 years ago Essential hypertension   [facility] Pickard, Priscille Heidelberg, MD              Passed - K in normal range and within 180 days    Potassium  Date Value Ref Range Status  04/27/2023 4.6 3.5 - 5.3 mmol/L Final         Passed - Na in normal range and within 180 days    Sodium  Date Value Ref Range Status  04/27/2023 140 135 - 146 mmol/L Final         Passed - Cr in normal range and within 180 days    Creat  Date Value Ref Range Status  04/27/2023 1.13 0.70 - 1.22 mg/dL Final   Creatinine, Urine  Date Value Ref Range Status  04/27/2023 23 20 - 320 mg/dL Final         Passed - Last BP in normal range    BP Readings from Last 1 Encounters:  04/17/23 122/64

## 2023-06-15 DIAGNOSIS — H04123 Dry eye syndrome of bilateral lacrimal glands: Secondary | ICD-10-CM | POA: Diagnosis not present

## 2023-06-15 DIAGNOSIS — Z961 Presence of intraocular lens: Secondary | ICD-10-CM | POA: Diagnosis not present

## 2023-06-15 DIAGNOSIS — H401131 Primary open-angle glaucoma, bilateral, mild stage: Secondary | ICD-10-CM | POA: Diagnosis not present

## 2023-07-24 ENCOUNTER — Other Ambulatory Visit: Payer: Self-pay | Admitting: Family Medicine

## 2023-07-25 NOTE — Telephone Encounter (Signed)
Requested Prescriptions  Pending Prescriptions Disp Refills   ONETOUCH ULTRA test strip [Pharmacy Med Name: OneTouch Ultra Blue In Vitro Strip] 100 each 0    Sig: USE 1 STRIP TO CHECK GLUCOSE EACH DAY FASTING     Endocrinology: Diabetes - Testing Supplies Failed - 07/24/2023  4:21 PM      Failed - Valid encounter within last 12 months    Recent Outpatient Visits           1 year ago Essential hypertension   [facility] Donita Brooks, MD   2 years ago Diabetes mellitus without complication (HCC)   [facility] Donita Brooks, MD   3 years ago Diabetes mellitus without complication (HCC)   [facility] Donita Brooks, MD   4 years ago Diabetes mellitus without complication (HCC)   [facility] Pickard, Priscille Heidelberg, MD   4 years ago Essential hypertension   [facility] Pickard, Priscille Heidelberg, MD               Lancets [facility] DELICA PLUS [place]) MISC [Pharmacy Med Name: Dola Argyle LAN 33G MIS] 100 each 0    Sig: USE 1 LANCET TO CHECK GLUCOSE ONCE DAILY     Endocrinology: Diabetes - Testing Supplies Failed - 07/24/2023  4:21 PM      Failed - Valid encounter within last 12 months    Recent Outpatient Visits           1 year ago Essential hypertension   [facility] Donita Brooks, MD   2 years ago Diabetes mellitus without complication [facility])   [facility] Donita Brooks, MD   3 years ago Diabetes mellitus without complication [facility])   [facility] Donita Brooks, MD   4 years ago Diabetes mellitus without complication (HCC)   [facility] Pickard, Priscille Heidelberg, MD   4 years ago Essential hypertension   [facility] Pickard, Priscille Heidelberg, MD

## 2023-10-17 DIAGNOSIS — Z961 Presence of intraocular lens: Secondary | ICD-10-CM | POA: Diagnosis not present

## 2023-10-17 DIAGNOSIS — H401131 Primary open-angle glaucoma, bilateral, mild stage: Secondary | ICD-10-CM | POA: Diagnosis not present

## 2023-10-17 DIAGNOSIS — H04123 Dry eye syndrome of bilateral lacrimal glands: Secondary | ICD-10-CM | POA: Diagnosis not present

## 2023-10-24 ENCOUNTER — Other Ambulatory Visit: Payer: Self-pay | Admitting: Family Medicine

## 2023-10-24 DIAGNOSIS — I10 Essential (primary) hypertension: Secondary | ICD-10-CM

## 2023-10-31 DIAGNOSIS — H401131 Primary open-angle glaucoma, bilateral, mild stage: Secondary | ICD-10-CM | POA: Diagnosis not present

## 2023-10-31 DIAGNOSIS — H04123 Dry eye syndrome of bilateral lacrimal glands: Secondary | ICD-10-CM | POA: Diagnosis not present

## 2023-10-31 DIAGNOSIS — Z961 Presence of intraocular lens: Secondary | ICD-10-CM | POA: Diagnosis not present

## 2024-01-22 ENCOUNTER — Other Ambulatory Visit: Payer: Self-pay | Admitting: Family Medicine

## 2024-01-22 DIAGNOSIS — I10 Essential (primary) hypertension: Secondary | ICD-10-CM

## 2024-01-31 DIAGNOSIS — Z961 Presence of intraocular lens: Secondary | ICD-10-CM | POA: Diagnosis not present

## 2024-01-31 DIAGNOSIS — H401131 Primary open-angle glaucoma, bilateral, mild stage: Secondary | ICD-10-CM | POA: Diagnosis not present

## 2024-01-31 DIAGNOSIS — H04123 Dry eye syndrome of bilateral lacrimal glands: Secondary | ICD-10-CM | POA: Diagnosis not present

## 2024-02-23 DIAGNOSIS — H401131 Primary open-angle glaucoma, bilateral, mild stage: Secondary | ICD-10-CM | POA: Diagnosis not present

## 2024-03-05 DIAGNOSIS — H04123 Dry eye syndrome of bilateral lacrimal glands: Secondary | ICD-10-CM | POA: Diagnosis not present

## 2024-03-05 DIAGNOSIS — Z961 Presence of intraocular lens: Secondary | ICD-10-CM | POA: Diagnosis not present

## 2024-03-05 DIAGNOSIS — H401131 Primary open-angle glaucoma, bilateral, mild stage: Secondary | ICD-10-CM | POA: Diagnosis not present

## 2024-04-23 ENCOUNTER — Other Ambulatory Visit: Payer: Self-pay | Admitting: Family Medicine

## 2024-04-23 DIAGNOSIS — I10 Essential (primary) hypertension: Secondary | ICD-10-CM

## 2024-05-02 ENCOUNTER — Other Ambulatory Visit: Payer: Self-pay | Admitting: Family Medicine

## 2024-05-02 DIAGNOSIS — I10 Essential (primary) hypertension: Secondary | ICD-10-CM

## 2024-05-02 NOTE — Telephone Encounter (Signed)
 Copied from CRM [id number]. Topic: Clinical - Medication Refill >> May 02, 2024  2:09 PM Pinkey ORN wrote: Medication: metFORMIN  (GLUCOPHAGE ) 500 MG tablet, atorvastatin  (LIPITOR) 10 MG tablet, benazepril  (LOTENSIN ) 10 MG tablet, triamterene -hydrochlorothiazide (MAXZIDE-25) 37.5-25 MG tablet  Has the patient contacted their pharmacy? Yes (Agent: If no, request that the patient contact the pharmacy for the refill. If patient does not wish to contact the pharmacy document the reason why and proceed with request.) (Agent: If yes, when and what did the pharmacy advise?)  This is the patient's preferred pharmacy:  [id number] - [place] - [address] [place] ([place]) [place] [address] Phone: [phone] Fax: [phone]   Is this the correct pharmacy for this prescription? Yes If no, delete pharmacy and type the correct one.   Has the prescription been filled recently? No  Is the patient out of the medication? Yes  Has the patient been seen for an appointment in the last year OR does the patient have an upcoming appointment? Yes  Can we respond through MyChart? Yes  Agent: Please be advised that Rx refills may take up to 3 business days. We ask that you follow-up with your pharmacy.

## 2024-05-03 ENCOUNTER — Other Ambulatory Visit: Payer: Self-pay

## 2024-05-03 DIAGNOSIS — I10 Essential (primary) hypertension: Secondary | ICD-10-CM

## 2024-05-03 NOTE — Telephone Encounter (Signed)
 Requested medication (s) are due for refill today: yes  Requested medication (s) are on the active medication list: yes  Last refill:  01/23/24 for 3 months each  Future visit scheduled: yes  Notes to clinic:  overdue lab work    Requested Prescriptions  Pending Prescriptions Disp Refills   atorvastatin  (LIPITOR) 10 MG tablet 90 tablet 0    Sig: Take 1 tablet (10 mg total) by mouth daily with breakfast.     Cardiovascular:  Antilipid - Statins Failed - 05/03/2024 12:35 PM      Failed - Valid encounter within last 12 months    Recent Outpatient Visits           1 year ago Diabetes mellitus without complication [facility])   [facility] Medicine Duanne Butler DASEN, MD              Failed - Lipid Panel in normal range within the last 12 months    Cholesterol  Date Value Ref Range Status  04/27/2023 172 <200 mg/dL Final   LDL Cholesterol (Calc)  Date Value Ref Range Status  04/27/2023 91 mg/dL (calc) Final    Comment:    Reference range: <100 . Desirable range <100 mg/dL for primary prevention;   <70 mg/dL for patients with CHD or diabetic patients  with > or = 2 CHD risk factors. SABRA LDL-C is now calculated using the Martin-Hopkins  calculation, which is a validated novel method providing  better accuracy than the Friedewald equation in the  estimation of LDL-C.  Aaron Mcfarland et al. Aaron Mcfarland. 7986;689(80): 2061-2068  (http://education.[facility].com/faq/FAQ164)    HDL  Date Value Ref Range Status  04/27/2023 60 > OR = 40 mg/dL Final   Triglycerides  Date Value Ref Range Status  04/27/2023 109 <150 mg/dL Final         Passed - Patient is not pregnant       benazepril  (LOTENSIN ) 10 MG tablet 90 tablet 0    Sig: Take 1 tablet (10 mg total) by mouth daily.     Cardiovascular:  ACE Inhibitors Failed - 05/03/2024 12:35 PM      Failed - Cr in normal range and within 180 days    Creat  Date Value Ref Range Status  04/27/2023 1.13 0.70 - 1.22 mg/dL  Final   Creatinine, Urine  Date Value Ref Range Status  04/27/2023 23 20 - 320 mg/dL Final         Failed - K in normal range and within 180 days    Potassium  Date Value Ref Range Status  04/27/2023 4.6 3.5 - 5.3 mmol/L Final         Failed - Valid encounter within last 6 months    Recent Outpatient Visits           1 year ago Diabetes mellitus without complication [facility])    [facility] Medicine Duanne Butler DASEN, MD              Passed - Patient is not pregnant      Passed - Last BP in normal range    BP Readings from Last 1 Encounters:  04/17/23 122/64          metFORMIN  (GLUCOPHAGE ) 500 MG tablet 180 tablet 0    Sig: Take 1 tablet (500 mg total) by mouth 2 (two) times daily.     Endocrinology:  Diabetes - Biguanides Failed - 05/03/2024 12:35 PM      Failed -  Cr in normal range and within 360 days    Creat  Date Value Ref Range Status  04/27/2023 1.13 0.70 - 1.22 mg/dL Final   Creatinine, Urine  Date Value Ref Range Status  04/27/2023 23 20 - 320 mg/dL Final         Failed - HBA1C is between 0 and 7.9 and within 180 days    Hgb A1C (fingerstick)  Date Value Ref Range Status  03/22/2017 5.9 (H) <5.7 % Final    Comment:                                                                           According to the ADA Clinical Practice Recommendations for 2011, when HbA1c is used as a screening test:     >=6.5%   Diagnostic of Diabetes Mellitus            (if abnormal result is confirmed)   5.7-6.4%   Increased risk of developing Diabetes Mellitus   References:Diagnosis and Classification of Diabetes Mellitus,Diabetes Care,2011,34(Suppl 1):S62-S69 and Standards of Medical Care in         Diabetes - 2011,Diabetes Care,2011,34 (Suppl 1):S11-S61.      Hgb A1c MFr Bld  Date Value Ref Range Status  04/27/2023 6.9 (H) <5.7 % of total Hgb Final    Comment:    For someone without known diabetes, a hemoglobin A1c value of 6.5% or greater  indicates that they may have  diabetes and this should be confirmed with a follow-up  test. . For someone with known diabetes, a value <7% indicates  that their diabetes is well controlled and a value  greater than or equal to 7% indicates suboptimal  control. A1c targets should be individualized based on  duration of diabetes, age, comorbid conditions, and  other considerations. . Currently, no consensus exists regarding use of hemoglobin A1c for diagnosis of diabetes for children. .          Failed - eGFR in normal range and within 360 days    GFR, Est African American  Date Value Ref Range Status  09/21/2020 59 (L) > OR = 60 mL/min/1.77m2 Final   GFR, Est Non African American  Date Value Ref Range Status  09/21/2020 51 (L) > OR = 60 mL/min/1.8m2 Final   eGFR  Date Value Ref Range Status  04/27/2023 65 > OR = 60 mL/min/1.42m2 Final         Failed - B12 Level in normal range and within 720 days    Vitamin B-12  Date Value Ref Range Status  01/30/2020 >2,000 (H) 200 - 1,100 pg/mL Final         Failed - Valid encounter within last 6 months    Recent Outpatient Visits           1 year ago Diabetes mellitus without complication [facility])   [place] [facility] Pickard, Butler DASEN, MD              Failed - CBC within normal limits and completed in the last 12 months    WBC  Date Value Ref Range Status  02/01/2022 5.3 3.8 - 10.8 Thousand/uL Final   RBC  Date Value Ref  Range Status  02/01/2022 3.99 (L) 4.20 - 5.80 Million/uL Final   Hemoglobin  Date Value Ref Range Status  02/01/2022 13.5 13.2 - 17.1 g/dL Final   HCT  Date Value Ref Range Status  02/01/2022 39.0 38.5 - 50.0 % Final   MCHC  Date Value Ref Range Status  02/01/2022 34.6 32.0 - 36.0 g/dL Final   [facility]  Date Value Ref Range Status  02/01/2022 33.8 (H) 27.0 - 33.0 pg Final   MCV  Date Value Ref Range Status  02/01/2022 97.7 80.0 - 100.0 fL Final   No results found for:  PLTCOUNTKUC, LABPLAT, POCPLA RDW  Date Value Ref Range Status  02/01/2022 12.6 11.0 - 15.0 % Final          triamterene -hydrochlorothiazide (MAXZIDE-25) 37.5-25 MG tablet 90 tablet 0     Cardiovascular: Diuretic Combos Failed - 05/03/2024 12:35 PM      Failed - K in normal range and within 180 days    Potassium  Date Value Ref Range Status  04/27/2023 4.6 3.5 - 5.3 mmol/L Final         Failed - Na in normal range and within 180 days    Sodium  Date Value Ref Range Status  04/27/2023 140 135 - 146 mmol/L Final         Failed - Cr in normal range and within 180 days    Creat  Date Value Ref Range Status  04/27/2023 1.13 0.70 - 1.22 mg/dL Final   Creatinine, Urine  Date Value Ref Range Status  04/27/2023 23 20 - 320 mg/dL Final         Failed - Valid encounter within last 6 months    Recent Outpatient Visits           1 year ago Diabetes mellitus without complication [facility])   [place] [facility] Duanne Butler DASEN, MD              Passed - Last BP in normal range    BP Readings from Last 1 Encounters:  04/17/23 122/64

## 2024-05-09 DIAGNOSIS — Z961 Presence of intraocular lens: Secondary | ICD-10-CM | POA: Diagnosis not present

## 2024-05-09 DIAGNOSIS — H04123 Dry eye syndrome of bilateral lacrimal glands: Secondary | ICD-10-CM | POA: Diagnosis not present

## 2024-05-09 DIAGNOSIS — H401131 Primary open-angle glaucoma, bilateral, mild stage: Secondary | ICD-10-CM | POA: Diagnosis not present

## 2024-05-17 ENCOUNTER — Encounter: Payer: Self-pay | Admitting: Family Medicine

## 2024-05-17 ENCOUNTER — Ambulatory Visit: Admitting: Family Medicine

## 2024-05-17 VITALS — BP 138/76 | HR 67 | Temp 97.6°F | Ht 71.0 in | Wt 184.2 lb

## 2024-05-17 DIAGNOSIS — E785 Hyperlipidemia, unspecified: Secondary | ICD-10-CM

## 2024-05-17 DIAGNOSIS — Z7984 Long term (current) use of oral hypoglycemic drugs: Secondary | ICD-10-CM

## 2024-05-17 DIAGNOSIS — E119 Type 2 diabetes mellitus without complications: Secondary | ICD-10-CM

## 2024-05-17 DIAGNOSIS — I10 Essential (primary) hypertension: Secondary | ICD-10-CM | POA: Diagnosis not present

## 2024-05-17 MED ORDER — TRIAMTERENE-HCTZ 37.5-25 MG PO TABS: 1.0000 | ORAL_TABLET | Freq: Every day | ORAL | 0 refills | Status: AC

## 2024-05-17 MED ORDER — ATORVASTATIN CALCIUM 10 MG PO TABS: 10.0000 mg | ORAL_TABLET | Freq: Every day | ORAL | 0 refills | Status: DC

## 2024-05-17 MED ORDER — BENAZEPRIL HCL 10 MG PO TABS: 10.0000 mg | ORAL_TABLET | Freq: Every day | ORAL | 0 refills | Status: DC

## 2024-05-17 MED ORDER — METFORMIN HCL 500 MG PO TABS
500.0000 mg | ORAL_TABLET | Freq: Two times a day (BID) | ORAL | 0 refills | Status: DC
Start: 2024-05-17 — End: 2024-08-20

## 2024-05-17 NOTE — Progress Notes (Signed)
 Subjective:    Patient ID: Aaron Mcfarland, male    DOB: 09/03/1941, 83 y.o.   MRN: [id number]  Medication Refill  Patient is a very pleasant 83 year old African-American gentleman here today following up his diabetes.  He has been without his blood pressure medicine for 3 weeks.  However his blood pressure is only mildly elevated at 146/78.  He denies any chest pain or shortness of breath or dyspnea on exertion.  He has not had lab work in over a year.  He denies any polyuria polydipsia or blurry vision.  He is seeing his eye doctor regularly.  In fact he saw his eye doctor less than a week ago.  He does report some burning pain in his status is not severe.  He has normal sensation to 10 g monofilament bilaterally and excellent pulses.  Overall he is doing well with no concerns. Past Medical History:  Diagnosis Date   AAA (abdominal aortic aneurysm) (HCC)    3.2 cm 2021   Allergy    Cataract    right eye with lens implant    Diabetes mellitus without complication (HCC)    GERD (gastroesophageal reflux disease)    some   Glaucoma    left eye   Hyperlipidemia    Hypertension    Past Surgical History:  Procedure Laterality Date   CATARACT EXTRACTION Bilateral    with lens implant 11-03-2014   COLONOSCOPY  2004   Dr Kristie    TONSILLECTOMY AND ADENOIDECTOMY     age 83   Current Outpatient Medications on File Prior to Visit  Medication Sig Dispense Refill   aspirin  81 MG tablet Take 1 tablet (81 mg total) by mouth daily. 30 tablet 11   Blood Glucose Monitoring Suppl (ONE TOUCH ULTRA SYSTEM KIT) W/DEVICE KIT 1 kit by Does not apply route once. 1 each 0   COMBIGAN 0.2-0.5 % ophthalmic solution Apply 1 drop to eye 2 (two) times daily.     Fluticasone Propionate (FLONASE ALLERGY RELIEF NA) Place 2 sprays into the nose daily. Buying OTC     Lancets (ONETOUCH DELICA PLUS LANCET33G) MISC USE 1 LANCET TO CHECK GLUCOSE ONCE DAILY 100 each 0   latanoprost (XALATAN) 0.005 % ophthalmic  solution Place 1 drop into the left eye at bedtime.     LUMIGAN 0.01 % SOLN SMARTSIG:In Eye(s)     Multiple Vitamins-Minerals (MULTIVITAMIN PO) Take by mouth.     ONETOUCH ULTRA test strip USE 1 STRIP TO CHECK GLUCOSE EACH DAY FASTING 100 each 0   SPIKEVAX syringe  (Patient not taking: Reported on 04/17/2023)     No current facility-administered medications on file prior to visit.   No Known Allergies Social History   Socioeconomic History   Marital status: Married    Spouse name: Not on file   Number of children: Not on file   Years of education: Not on file   Highest education level: Not on file  Occupational History   Not on file  Tobacco Use   Smoking status: Former    Current packs/day: 0.00    Types: Cigarettes    Quit date: 04/24/1966    Years since quitting: 58.1   Smokeless tobacco: Never  Substance and Sexual Activity   Alcohol use: Yes   Drug use: No   Sexual activity: Yes    Birth control/protection: None  Other Topics Concern   Not on file  Social History Narrative   Not on file  Social Drivers of Corporate investment banker Strain: Low Risk  (08/03/2022)   Overall Financial Resource Strain (CARDIA)    Difficulty of Paying Living Expenses: Not hard at all  Food Insecurity: No Food Insecurity (08/03/2022)   Hunger Vital Sign    Worried About Running Out of Food in the Last Year: Never true    Ran Out of Food in the Last Year: Never true  Transportation Needs: No Transportation Needs (08/03/2022)   PRAPARE - Administrator, Civil Service (Medical): No    Lack of Transportation (Non-Medical): No  Physical Activity: Insufficiently Active (08/03/2022)   Exercise Vital Sign    Days of Exercise per Week: 3 days    Minutes of Exercise per Session: 30 min  Stress: No Stress Concern Present (08/03/2022)   [employer] of Occupational Health - Occupational Stress Questionnaire    Feeling of Stress : Not at all  Social Connections: Moderately  Isolated (08/03/2022)   Social Connection and Isolation Panel    Frequency of Communication with Friends and Family: Three times a week    Frequency of Social Gatherings with Friends and Family: Three times a week    Attends Religious Services: Never    Active Member of Clubs or Organizations: No    Attends Banker Meetings: Never    Marital Status: Married  Catering manager Violence: Not At Risk (08/03/2022)   Humiliation, Afraid, Rape, and Kick questionnaire    Fear of Current or Ex-Partner: No    Emotionally Abused: No    Physically Abused: No    Sexually Abused: No      Review of Systems  All other systems reviewed and are negative.      Objective:   Physical Exam Vitals reviewed.  Constitutional:      General: He is not in acute distress.    Appearance: Normal appearance. He is normal weight. He is not ill-appearing.  Neck:     Vascular: No carotid bruit.  Cardiovascular:     Rate and Rhythm: Normal rate and regular rhythm.     Pulses: Normal pulses.     Heart sounds: Normal heart sounds. No murmur heard.    No friction rub. No gallop.  Pulmonary:     Effort: Pulmonary effort is normal. No respiratory distress.     Breath sounds: Normal breath sounds. No stridor. No wheezing, rhonchi or rales.  Chest:     Chest wall: No tenderness.  Abdominal:     General: Bowel sounds are normal. There is no distension.     Palpations: Abdomen is soft. There is no mass.     Tenderness: There is no abdominal tenderness. There is no right CVA tenderness, left CVA tenderness, guarding or rebound.     Hernia: No hernia is present.  Musculoskeletal:     Right lower leg: No edema.     Left lower leg: No edema.  Lymphadenopathy:     Cervical: No cervical adenopathy.  Neurological:     Mental Status: He is alert.           Assessment & Plan:  Diabetes mellitus without complication (HCC) - Plan: atorvastatin  (LIPITOR) 10 MG tablet, metFORMIN  (GLUCOPHAGE ) 500 MG  tablet, Hemoglobin A1c, CBC with Differential/Platelet, Comprehensive metabolic panel with GFR, Lipid panel, Microalbumin/Creatinine Ratio, Urine  Essential hypertension - Plan: benazepril  (LOTENSIN ) 10 MG tablet, triamterene -hydrochlorothiazide (MAXZIDE-25) 37.5-25 MG tablet  Hyperlipidemia, unspecified hyperlipidemia type - Plan: atorvastatin  (LIPITOR) 10 MG tablet Blood pressure is  elevated however I have asked the patient to resume his Maxide and benazepril  soon as possible and this should correct that.  Meanwhile I will check his hemoglobin A1c.  Ideally would like his A1c less than 6.5.  I will check a urine microalbumin to creatinine ratio.  Last year was excellent.  I will check a fasting lipid panel.  Goal LDL cholesterol is less than 100.  Diabetic foot exam and diabetic eye exam are up-to-date.  Patient has a documented history of a AAA.  However an ultrasound of the aorta in 2023 showed no evidence of a AAA.

## 2024-05-18 LAB — COMPREHENSIVE METABOLIC PANEL WITH GFR
AG Ratio: 1.7 (calc) (ref 1.0–2.5)
ALT: 14 U/L (ref 9–46)
AST: 14 U/L (ref 10–35)
Albumin: 4.5 g/dL (ref 3.6–5.1)
Alkaline phosphatase (APISO): 96 U/L (ref 35–144)
BUN: 24 mg/dL (ref 7–25)
CO2: 24 mmol/L (ref 20–32)
Calcium: 10.1 mg/dL (ref 8.6–10.3)
Chloride: 104 mmol/L (ref 98–110)
Creat: 1.18 mg/dL (ref 0.70–1.22)
Globulin: 2.7 g/dL (ref 1.9–3.7)
Glucose, Bld: 225 mg/dL — ABNORMAL HIGH (ref 65–99)
Potassium: 4.3 mmol/L (ref 3.5–5.3)
Sodium: 138 mmol/L (ref 135–146)
Total Bilirubin: 0.7 mg/dL (ref 0.2–1.2)
Total Protein: 7.2 g/dL (ref 6.1–8.1)
eGFR: 61 mL/min/1.73m2 (ref >=60)

## 2024-05-18 LAB — CBC WITH DIFFERENTIAL/PLATELET
Absolute Lymphocytes: 1793 {cells}/uL (ref 850–3900)
Absolute Monocytes: 567 {cells}/uL (ref 200–950)
Basophils Absolute: 28 {cells}/uL (ref 0–200)
Basophils Relative: 0.5 %
Eosinophils Absolute: 143 {cells}/uL (ref 15–500)
Eosinophils Relative: 2.6 %
HCT: 37.7 % — ABNORMAL LOW (ref 38.5–50.0)
Hemoglobin: 12.8 g/dL — ABNORMAL LOW (ref 13.2–17.1)
MCH: 33.9 pg — ABNORMAL HIGH (ref 27.0–33.0)
MCHC: 34 g/dL (ref 32.0–36.0)
MCV: 99.7 fL (ref 80.0–100.0)
MPV: 9.5 fL (ref 7.5–12.5)
Monocytes Relative: 10.3 %
Neutro Abs: 2970 {cells}/uL (ref 1500–7800)
Neutrophils Relative %: 54 %
Platelets: 250 Thousand/uL (ref 140–400)
RBC: 3.78 Million/uL — ABNORMAL LOW (ref 4.20–5.80)
RDW: 12.6 % (ref 11.0–15.0)
Total Lymphocyte: 32.6 %
WBC: 5.5 Thousand/uL (ref 3.8–10.8)

## 2024-05-18 LAB — LIPID PANEL
Cholesterol: 188 mg/dL (ref <200)
HDL: 70 mg/dL (ref >=40)
LDL Cholesterol (Calc): 93 mg/dL
Non-HDL Cholesterol (Calc): 118 mg/dL (ref <130)
Total CHOL/HDL Ratio: 2.7 (calc) (ref <5.0)
Triglycerides: 158 mg/dL — ABNORMAL HIGH (ref <150)

## 2024-05-18 LAB — MICROALBUMIN / CREATININE URINE RATIO
Creatinine, Urine: 157 mg/dL (ref 20–320)
Microalb Creat Ratio: 13 mg/g{creat} (ref <30)
Microalb, Ur: 2 mg/dL

## 2024-05-18 LAB — HEMOGLOBIN A1C
Hgb A1c MFr Bld: 7 % — ABNORMAL HIGH (ref <5.7)
Mean Plasma Glucose: 154 mg/dL
eAG (mmol/L): 8.5 mmol/L

## 2024-05-20 ENCOUNTER — Ambulatory Visit: Payer: Self-pay | Admitting: Family Medicine

## 2024-05-23 ENCOUNTER — Ambulatory Visit (INDEPENDENT_AMBULATORY_CARE_PROVIDER_SITE_OTHER): Admitting: *Deleted

## 2024-05-23 ENCOUNTER — Other Ambulatory Visit

## 2024-05-23 VITALS — Ht 71.0 in | Wt 184.0 lb

## 2024-05-23 DIAGNOSIS — Z0000 Encounter for general adult medical examination without abnormal findings: Secondary | ICD-10-CM | POA: Diagnosis not present

## 2024-05-23 NOTE — Progress Notes (Signed)
 Subjective:   Aaron Mcfarland is a 83 y.o. male who presents for Medicare Annual/Subsequent preventive examination.  Visit Complete: Virtual I connected with  Aaron Mcfarland on 05/23/24 by a audio enabled telemedicine application and verified that I am speaking with the correct person using two identifiers.  Patient Location: Home  Provider Location: Home Office  I discussed the limitations of evaluation and management by telemedicine. The patient expressed understanding and agreed to proceed.  Vital Signs: Because this visit was a virtual/telehealth visit, some criteria may be missing or patient reported. Any vitals not documented were not able to be obtained and vitals that have been documented are patient reported.   Cardiac Risk Factors include: advanced age (>14men, >30 women);male gender;diabetes mellitus;obesity (BMI >30kg/m2);hypertension     Objective:    Today's Vitals   05/23/24 1131  Weight: 184 lb (83.5 kg)  Height: 5' 11 (1.803 m)   Body mass index is 25.66 kg/m.     05/23/2024   11:29 AM 08/03/2022    3:24 PM 03/11/2021   12:08 PM 07/18/2017    7:44 AM 07/04/2017    8:54 AM  Advanced Directives  Does Patient Have a Medical Advance Directive? No No No No  No   Would patient like information on creating a medical advance directive? No - Patient declined Yes (MAU/Ambulatory/Procedural Areas - Information given) No - Patient declined       Data saved with a previous flowsheet row definition    Current Medications (verified) Outpatient Encounter Medications as of 05/23/2024  Medication Sig   aspirin  81 MG tablet Take 1 tablet (81 mg total) by mouth daily.   atorvastatin  (LIPITOR) 10 MG tablet Take 1 tablet (10 mg total) by mouth daily with breakfast.   benazepril  (LOTENSIN ) 10 MG tablet Take 1 tablet (10 mg total) by mouth daily.   Blood Glucose Monitoring Suppl (ONE TOUCH ULTRA SYSTEM KIT) W/DEVICE KIT 1 kit by Does not apply route once.   COMBIGAN  0.2-0.5 % ophthalmic solution Apply 1 drop to eye 2 (two) times daily.   Fluticasone Propionate (FLONASE ALLERGY RELIEF NA) Place 2 sprays into the nose daily. Buying OTC   Lancets (ONETOUCH DELICA PLUS LANCET33G) MISC USE 1 LANCET TO CHECK GLUCOSE ONCE DAILY   latanoprost (XALATAN) 0.005 % ophthalmic solution Place 1 drop into the left eye at bedtime.   LUMIGAN 0.01 % SOLN SMARTSIG:In Eye(s)   metFORMIN  (GLUCOPHAGE ) 500 MG tablet Take 1 tablet (500 mg total) by mouth 2 (two) times daily.   Multiple Vitamins-Minerals (MULTIVITAMIN PO) Take by mouth.   ONETOUCH ULTRA test strip USE 1 STRIP TO CHECK GLUCOSE EACH DAY FASTING   triamterene -hydrochlorothiazide (MAXZIDE-25) 37.5-25 MG tablet Take 1 tablet by mouth daily.   SPIKEVAX syringe  (Patient not taking: Reported on 05/23/2024)   No facility-administered encounter medications on file as of 05/23/2024.    Allergies (verified) Patient has no known allergies.   History: Past Medical History:  Diagnosis Date   AAA (abdominal aortic aneurysm) (HCC)    3.2 cm 2021   Allergy    Cataract    right eye with lens implant    Diabetes mellitus without complication (HCC)    GERD (gastroesophageal reflux disease)    some   Glaucoma    left eye   Hyperlipidemia    Hypertension    Past Surgical History:  Procedure Laterality Date   CATARACT EXTRACTION Bilateral    with lens implant 11-03-2014   COLONOSCOPY  2004   Dr Kristie    TONSILLECTOMY AND ADENOIDECTOMY     age 17   Family History  Problem Relation Age of Onset   Cancer Mother        ovarian   Ovarian cancer Mother    Hypertension Father    Stroke Father    Diabetes Brother    Heart disease Brother    Stroke Brother    Colon cancer Neg Hx    Colon polyps Neg Hx    Esophageal cancer Neg Hx    Rectal cancer Neg Hx    Stomach cancer Neg Hx    Social History   Socioeconomic History   Marital status: Married    Spouse name: Not on file   Number of children: Not on file    Years of education: Not on file   Highest education level: Not on file  Occupational History   Not on file  Tobacco Use   Smoking status: Former    Current packs/day: 0.00    Types: Cigarettes    Quit date: 04/24/1966    Years since quitting: 58.1   Smokeless tobacco: Never  Substance and Sexual Activity   Alcohol use: Yes   Drug use: No   Sexual activity: Yes    Birth control/protection: None  Other Topics Concern   Not on file  Social History Narrative   Not on file   Social Drivers of Health   Financial Resource Strain: Low Risk  (05/23/2024)   Overall Financial Resource Strain (CARDIA)    Difficulty of Paying Living Expenses: Not hard at all  Food Insecurity: No Food Insecurity (05/23/2024)   Hunger Vital Sign    Worried About Running Out of Food in the Last Year: Never true    Ran Out of Food in the Last Year: Never true  Transportation Needs: No Transportation Needs (05/23/2024)   PRAPARE - Administrator, Civil Service (Medical): No    Lack of Transportation (Non-Medical): No  Physical Activity: Inactive (05/23/2024)   Exercise Vital Sign    Days of Exercise per Week: 0 days    Minutes of Exercise per Session: 0 min  Stress: No Stress Concern Present (05/23/2024)   [employer] of Occupational Health - Occupational Stress Questionnaire    Feeling of Stress: Not at all  Social Connections: Moderately Isolated (05/23/2024)   Social Connection and Isolation Panel    Frequency of Communication with Friends and Family: More than three times a week    Frequency of Social Gatherings with Friends and Family: Once a week    Attends Religious Services: Never    Database administrator or Organizations: No    Attends Engineer, structural: Never    Marital Status: Married    Tobacco Counseling Counseling given: Not Answered   Clinical Intake:  Pre-visit preparation completed: Yes  Pain : No/denies pain     Diabetes: Yes CBG done?: No Did  pt. bring in CBG monitor from home?: No  How often do you need to have someone help you when you read instructions, pamphlets, or other written materials from your doctor or pharmacy?: 1 - Never  Interpreter Needed?: No  Information entered by :: Mliss Graff LPN   Activities of Daily Living    05/23/2024   11:43 AM  In your present state of health, do you have any difficulty performing the following activities:  Hearing? 0  Vision? 0  Difficulty concentrating or making decisions? 0  Walking or climbing stairs? 0  Dressing or bathing? 0  Doing errands, shopping? 0  Preparing Food and eating ? N  Using the Toilet? N  In the past six months, have you accidently leaked urine? N  Do you have problems with loss of bowel control? N  Managing your Medications? N  Managing your Finances? N  Housekeeping or managing your Housekeeping? N    Patient Care Team: Duanne Butler DASEN, MD as PCP - General (Family Medicine)  Indicate any recent Medical Services you may have received from other than [facility] providers in the past year (date may be approximate).     Assessment:   This is a routine wellness examination for [place].  Hearing/Vision screen Hearing Screening - Comments:: No trouble hearing Vision Screening - Comments:: [facility] Up to date   Goals Addressed             This Visit's Progress    DIET - EAT MORE FRUITS AND VEGETABLES   On track    Keep healthy     Exercise 3x per week (30 min per time)   On track    Patient Stated       Keep weight down       Depression Screen    05/23/2024   11:33 AM 05/17/2024   11:17 AM 04/17/2023   11:30 AM 08/03/2022    3:02 PM 03/11/2021   12:09 PM 01/30/2020    8:08 AM 06/17/2019   10:34 AM  PHQ 2/9 Scores  PHQ - 2 Score 0 0 0 0 0 0 0  PHQ- 9 Score 2   0       Fall Risk    05/23/2024   11:29 AM 05/17/2024   11:17 AM 04/17/2023   11:30 AM 08/03/2022    3:02 PM 03/11/2021   12:09 PM  Fall Risk   Falls in the past year? 0 0 0 0  0  Number falls in past yr: 0 0 0  0  Injury with Fall? 0 0 0  0  Risk for fall due to :  No Fall Risks No Fall Risks  No Fall Risks  Follow up Falls evaluation completed;Education provided;Falls prevention discussed Falls evaluation completed Falls prevention discussed  Falls evaluation completed;Falls prevention discussed      Data saved with a previous flowsheet row definition    MEDICARE RISK AT HOME: Medicare Risk at Home Any stairs in or around the home?: No If so, are there any without handrails?: No Home free of loose throw rugs in walkways, pet beds, electrical cords, etc?: Yes Adequate lighting in your home to reduce risk of falls?: Yes Life alert?: No Use of a cane, walker or w/c?: No Grab bars in the bathroom?: Yes Shower chair or bench in shower?: Yes Elevated toilet seat or a handicapped toilet?: Yes  TIMED UP AND GO:  Was the test performed?  No    Cognitive Function:        05/23/2024   11:30 AM 08/03/2022    3:27 PM  6CIT Screen  What Year? 0 points 0 points  What month? 0 points 0 points  What time? 0 points 0 points  Count back from 20 0 points 0 points  Months in reverse 0 points 0 points  Repeat phrase 0 points 0 points  Total Score 0 points 0 points    Immunizations Immunization History  Administered Date(s) Administered   Fluad Quad(high Dose 65+) 06/04/2019   Influenza Split 06/12/2013,  06/02/2014   Influenza, High Dose Seasonal PF 06/02/2017, 06/08/2018   Influenza-Unspecified 06/15/2015, 06/02/2017, 06/08/2018   PFIZER(Purple Top)SARS-COV-2 Vaccination 11/16/2019, 12/11/2019, 03/09/2020, 07/09/2020   Pneumococcal Conjugate-13 08/14/2014   Pneumococcal Polysaccharide-23 07/25/2013   Tdap 07/25/2013   Zoster Recombinant(Shingrix) 09/09/2020, 03/09/2021    TDAP status: Due, Education has been provided regarding the importance of this vaccine. Advised may receive this vaccine at local pharmacy or Health Dept. Aware to provide a copy of the  vaccination record if obtained from local pharmacy or Health Dept. Verbalized acceptance and understanding.  Flu Vaccine status: Due, Education has been provided regarding the importance of this vaccine. Advised may receive this vaccine at local pharmacy or Health Dept. Aware to provide a copy of the vaccination record if obtained from local pharmacy or Health Dept. Verbalized acceptance and understanding.  Pneumococcal vaccine status: Up to date  Covid-19 vaccine status: Information provided on how to obtain vaccines.   Qualifies for Shingles Vaccine? No   Zostavax completed Yes   Shingrix Completed?: Yes  Screening Tests Health Maintenance  Topic Date Due   OPHTHALMOLOGY EXAM  01/27/2021   COVID-19 Vaccine (5 - 2024-25 season) 06/02/2024 (Originally 06/04/2023)   INFLUENZA VACCINE  12/31/2024 (Originally 05/03/2024)   DTaP/Tdap/Td (2 - Td or Tdap) 05/17/2025 (Originally 07/26/2023)   HEMOGLOBIN A1C  11/17/2024   Diabetic kidney evaluation - eGFR measurement  05/17/2025   Diabetic kidney evaluation - Urine ACR  05/17/2025   FOOT EXAM  05/17/2025   Medicare Annual Wellness (AWV)  05/23/2025   Pneumococcal Vaccine: 50+ Years  Completed   Zoster Vaccines- Shingrix  Completed   HPV VACCINES  Aged Out   Meningococcal B Vaccine  Aged Out   Colonoscopy  Discontinued    Health Maintenance  Health Maintenance Due  Topic Date Due   OPHTHALMOLOGY EXAM  01/27/2021    Colorectal cancer screening: No longer required.   Lung Cancer Screening: (Low Dose CT Chest recommended if Age 69-80 years, 20 pack-year currently smoking OR have quit w/in 15years.) does not qualify.   Lung Cancer Screening Referral:   Additional Screening:  Hepatitis C Screening: does not qualify  Vision Screening: Recommended annual ophthalmology exams for early detection of glaucoma and other disorders of the eye. Is the patient up to date with their annual eye exam?  Yes  Who is the provider or what is the name  of the office in which the patient attends annual eye exams? [facility] If pt is not established with a provider, would they like to be referred to a provider to establish care? No .   Dental Screening: Recommended annual dental exams for proper oral hygiene    Community Resource Referral / Chronic Care Management: CRR required this visit?  No   CCM required this visit?  No     Plan:     I have personally reviewed and noted the following in the patient's chart:   Medical and social history Use of alcohol, tobacco or illicit drugs  Current medications and supplements including opioid prescriptions. Patient is not currently taking opioid prescriptions. Functional ability and status Nutritional status Physical activity Advanced directives List of other physicians Hospitalizations, surgeries, and ER visits in previous 12 months Vitals Screenings to include cognitive, depression, and falls Referrals and appointments  In addition, I have reviewed and discussed with patient certain preventive protocols, quality metrics, and best practice recommendations. A written personalized care plan for preventive services as well as general preventive health recommendations were provided to patient.  Mliss Graff, LPN   [id number]   After Visit Summary: (MyChart) Due to this being a telephonic visit, the after visit summary with patients personalized plan was offered to patient via MyChart   Nurse Notes:

## 2024-05-23 NOTE — Patient Instructions (Signed)
 [name] , Thank you for taking time to come for your Medicare Wellness Visit. I appreciate your ongoing commitment to your health goals. Please review the following plan we discussed and let me know if I can assist you in the future.   Screening recommendations/referrals: Colonoscopy:  Recommended yearly ophthalmology/optometry visit for glaucoma screening and checkup Recommended yearly dental visit for hygiene and checkup  Vaccinations: Influenza vaccine:  Pneumococcal vaccine:  Tdap vaccine:  Shingles vaccine:      Preventive Care 65 Years and Older, Male Preventive care refers to lifestyle choices and visits with your health care provider that can promote health and wellness. What does preventive care include? A yearly physical exam. This is also called an annual well check. Dental exams once or twice a year. Routine eye exams. Ask your health care provider how often you should have your eyes checked. Personal lifestyle choices, including: Daily care of your teeth and gums. Regular physical activity. Eating a healthy diet. Avoiding tobacco and drug use. Limiting alcohol use. Practicing safe sex. Taking low doses of aspirin  every day. Taking vitamin and mineral supplements as recommended by your health care provider. What happens during an annual well check? The services and screenings done by your health care provider during your annual well check will depend on your age, overall health, lifestyle risk factors, and family history of disease. Counseling  Your health care provider may ask you questions about your: Alcohol use. Tobacco use. Drug use. Emotional well-being. Home and relationship well-being. Sexual activity. Eating habits. History of falls. Memory and ability to understand (cognition). Work and work Astronomer. Screening  You may have the following tests or measurements: Height, weight, and BMI. Blood pressure. Lipid and cholesterol levels. These  may be checked every 5 years, or more frequently if you are over 96 years old. Skin check. Lung cancer screening. You may have this screening every year starting at age 10 if you have a 30-pack-year history of smoking and currently smoke or have quit within the past 15 years. Fecal occult blood test (FOBT) of the stool. You may have this test every year starting at age 63. Flexible sigmoidoscopy or colonoscopy. You may have a sigmoidoscopy every 5 years or a colonoscopy every 10 years starting at age 41. Prostate cancer screening. Recommendations will vary depending on your family history and other risks. Hepatitis C blood test. Hepatitis B blood test. Sexually transmitted disease (STD) testing. Diabetes screening. This is done by checking your blood sugar (glucose) after you have not eaten for a while (fasting). You may have this done every 1-3 years. Abdominal aortic aneurysm (AAA) screening. You may need this if you are a current or former smoker. Osteoporosis. You may be screened starting at age 61 if you are at high risk. Talk with your health care provider about your test results, treatment options, and if necessary, the need for more tests. Vaccines  Your health care provider may recommend certain vaccines, such as: Influenza vaccine. This is recommended every year. Tetanus, diphtheria, and acellular pertussis (Tdap, Td) vaccine. You may need a Td booster every 10 years. Zoster vaccine. You may need this after age 98. Pneumococcal 13-valent conjugate (PCV13) vaccine. One dose is recommended after age 31. Pneumococcal polysaccharide (PPSV23) vaccine. One dose is recommended after age 71. Talk to your health care provider about which screenings and vaccines you need and how often you need them. This information is not intended to replace advice given to you by your health care  provider. Make sure you discuss any questions you have with your health care provider. Document Released:  10/16/2015 Document Revised: 06/08/2016 Document Reviewed: 07/21/2015 Elsevier Interactive Patient Education  2017 [employer].  Fall Prevention in the Home Falls can cause injuries. They can happen to people of all ages. There are many things you can do to make your home safe and to help prevent falls. What can I do on the outside of my home? Regularly fix the edges of walkways and driveways and fix any cracks. Remove anything that might make you trip as you walk through a door, such as a raised step or threshold. Trim any bushes or trees on the path to your home. Use bright outdoor lighting. Clear any walking paths of anything that might make someone trip, such as rocks or tools. Regularly check to see if handrails are loose or broken. Make sure that both sides of any steps have handrails. Any raised decks and porches should have guardrails on the edges. Have any leaves, snow, or ice cleared regularly. Use sand or salt on walking paths during winter. Clean up any spills in your garage right away. This includes oil or grease spills. What can I do in the bathroom? Use night lights. Install grab bars by the toilet and in the tub and shower. Do not use towel bars as grab bars. Use non-skid mats or decals in the tub or shower. If you need to sit down in the shower, use a plastic, non-slip stool. Keep the floor dry. Clean up any water that spills on the floor as soon as it happens. Remove soap buildup in the tub or shower regularly. Attach bath mats securely with double-sided non-slip rug tape. Do not have throw rugs and other things on the floor that can make you trip. What can I do in the bedroom? Use night lights. Make sure that you have a light by your bed that is easy to reach. Do not use any sheets or blankets that are too big for your bed. They should not hang down onto the floor. Have a firm chair that has side arms. You can use this for support while you get dressed. Do not have  throw rugs and other things on the floor that can make you trip. What can I do in the kitchen? Clean up any spills right away. Avoid walking on wet floors. Keep items that you use a lot in easy-to-reach places. If you need to reach something above you, use a strong step stool that has a grab bar. Keep electrical cords out of the way. Do not use floor polish or wax that makes floors slippery. If you must use wax, use non-skid floor wax. Do not have throw rugs and other things on the floor that can make you trip. What can I do with my stairs? Do not leave any items on the stairs. Make sure that there are handrails on both sides of the stairs and use them. Fix handrails that are broken or loose. Make sure that handrails are as long as the stairways. Check any carpeting to make sure that it is firmly attached to the stairs. Fix any carpet that is loose or worn. Avoid having throw rugs at the top or bottom of the stairs. If you do have throw rugs, attach them to the floor with carpet tape. Make sure that you have a light switch at the top of the stairs and the bottom of the stairs. If you do not have  them, ask someone to add them for you. What else can I do to help prevent falls? Wear shoes that: Do not have high heels. Have rubber bottoms. Are comfortable and fit you well. Are closed at the toe. Do not wear sandals. If you use a stepladder: Make sure that it is fully opened. Do not climb a closed stepladder. Make sure that both sides of the stepladder are locked into place. Ask someone to hold it for you, if possible. Clearly mark and make sure that you can see: Any grab bars or handrails. First and last steps. Where the edge of each step is. Use tools that help you move around (mobility aids) if they are needed. These include: Canes. Walkers. Scooters. Crutches. Turn on the lights when you go into a dark area. Replace any light bulbs as soon as they burn out. Set up your furniture so  you have a clear path. Avoid moving your furniture around. If any of your floors are uneven, fix them. If there are any pets around you, be aware of where they are. Review your medicines with your doctor. Some medicines can make you feel dizzy. This can increase your chance of falling. Ask your doctor what other things that you can do to help prevent falls. This information is not intended to replace advice given to you by your health care provider. Make sure you discuss any questions you have with your health care provider. Document Released: 07/16/2009 Document Revised: 02/25/2016 Document Reviewed: 10/24/2014 Elsevier Interactive Patient Education  2017 [employer].

## 2024-06-06 ENCOUNTER — Encounter: Admitting: Family Medicine

## 2024-07-16 ENCOUNTER — Ambulatory Visit: Payer: Self-pay

## 2024-07-16 NOTE — Telephone Encounter (Signed)
 FYI Only or Action Required?: Action required by provider: request for appointment.  Patient was last seen in primary care on 05/17/2024 by Duanne Butler DASEN, MD.  Called Nurse Triage reporting Otalgia.  Symptoms began several weeks ago.  Interventions attempted: Nothing.  Symptoms are: unchanged.  Triage Disposition: See Physician Within 24 Hours  Patient/caregiver understands and will follow disposition?: YesCopied from CRM #[id number]. Topic: Clinical - Red Word Triage >> Jul 16, 2024  2:18 PM Larissa S wrote: [facility] that prompted transfer to Nurse Triage: lt ear pain, dizziness Reason for Disposition  Earache  (Exceptions: Brief ear pain of lasting less than 60 minutes, or earache occurring during air travel.)  Answer Assessment - Initial Assessment Questions Humming and pain in left ear for last 2 weeks.    1. LOCATION: Which ear is involved?     Left ear 2. ONSET: When did the ear pain start?      2 weeks ago  3. SEVERITY: How bad is the pain?  (Scale 1-10; mild, moderate or severe)     2-3 4. URI SYMPTOMS: Do you have a runny nose or cough?     denies 5. FEVER: Do you have a fever? If Yes, ask: What is your temperature, how was it measured, and when did it start?     denies 6. CAUSE: Have you been swimming recently?, How often do you use Q-TIPS?, Have you had any recent air travel or scuba diving?     na 7. OTHER SYMPTOMS: Do you have any other symptoms? (e.g., decreased hearing, dizziness, headache, stiff neck, vomiting) Humming in ear  Protocols used: Rilla

## 2024-07-17 ENCOUNTER — Encounter: Payer: Self-pay | Admitting: Family Medicine

## 2024-07-17 ENCOUNTER — Ambulatory Visit (INDEPENDENT_AMBULATORY_CARE_PROVIDER_SITE_OTHER): Admitting: Family Medicine

## 2024-07-17 VITALS — BP 128/68 | HR 78 | Temp 98.2°F | Ht 71.0 in | Wt 168.0 lb

## 2024-07-17 DIAGNOSIS — H9312 Tinnitus, left ear: Secondary | ICD-10-CM | POA: Diagnosis not present

## 2024-07-17 NOTE — Progress Notes (Signed)
 Subjective:  HPI: Aaron Mcfarland is a 83 y.o. male presenting on 07/17/2024 for Acute Visit (Ear pain lft ear humming w/ shooting pain causing dizziness x 2-3 weeks )   HPI Discussed the use of AI scribe software for clinical note transcription with the patient, who gave verbal consent to proceed.  History of Present Illness [name] is an 83 year old male who presents with continuous humming in the ear for two weeks.  He has been experiencing continuous humming in his ear for approximately two weeks, with the intensity varying between loud and soft. He has a history of ear wax buildup, which was previously resolved by cleaning, but this time the humming persists despite his wife cleaning his ears with peroxide a week ago. Usually, the humming stops once the wax is cleared, but this time it has not.  He reports having ear pain two or three times, but not as the main problem. He denies fever, chills, body aches, sinus pressure, congestion, or hearing loss. He hears well and reports no trauma to the ear or exposure to high volume sounds.  He mentions experiencing imbalance, but denies any dizziness, room spinning, or headaches. The imbalance eases when he goes to bed. He is not on any new medications and has been taking the same medications for years.    Review of Systems  All other systems reviewed and are negative.   Relevant past medical history reviewed and updated as indicated.   Past Medical History:  Diagnosis Date   AAA (abdominal aortic aneurysm)    3.2 cm 2021   Allergy    Cataract    right eye with lens implant    Diabetes mellitus without complication (HCC)    GERD (gastroesophageal reflux disease)    some   Glaucoma    left eye   Hyperlipidemia    Hypertension      Past Surgical History:  Procedure Laterality Date   CATARACT EXTRACTION Bilateral    with lens implant 11-03-2014   COLONOSCOPY  2004   Dr Kristie    TONSILLECTOMY AND  ADENOIDECTOMY     age 75    Allergies and medications reviewed and updated.   Current Outpatient Medications:    aspirin  81 MG tablet, Take 1 tablet (81 mg total) by mouth daily., Disp: 30 tablet, Rfl: 11   atorvastatin  (LIPITOR) 10 MG tablet, Take 1 tablet (10 mg total) by mouth daily with breakfast., Disp: 90 tablet, Rfl: 0   benazepril  (LOTENSIN ) 10 MG tablet, Take 1 tablet (10 mg total) by mouth daily., Disp: 90 tablet, Rfl: 0   Blood Glucose Monitoring Suppl (ONE TOUCH ULTRA SYSTEM KIT) W/DEVICE KIT, 1 kit by Does not apply route once., Disp: 1 each, Rfl: 0   COMBIGAN 0.2-0.5 % ophthalmic solution, Apply 1 drop to eye 2 (two) times daily., Disp: , Rfl:    Fluticasone Propionate (FLONASE ALLERGY RELIEF NA), Place 2 sprays into the nose daily. Buying OTC, Disp: , Rfl:    Lancets (ONETOUCH DELICA PLUS LANCET33G) MISC, USE 1 LANCET TO CHECK GLUCOSE ONCE DAILY, Disp: 100 each, Rfl: 0   latanoprost (XALATAN) 0.005 % ophthalmic solution, Place 1 drop into the left eye at bedtime., Disp: , Rfl:    LUMIGAN 0.01 % SOLN, SMARTSIG:In Eye(s), Disp: , Rfl:    metFORMIN  (GLUCOPHAGE ) 500 MG tablet, Take 1 tablet (500 mg total) by mouth 2 (two) times daily., Disp: 180 tablet, Rfl: 0   Multiple Vitamins-Minerals (  MULTIVITAMIN PO), Take by mouth., Disp: , Rfl:    ONETOUCH ULTRA test strip, USE 1 STRIP TO CHECK GLUCOSE EACH DAY FASTING, Disp: 100 each, Rfl: 0   SPIKEVAX syringe, , Disp: , Rfl:    triamterene -hydrochlorothiazide (MAXZIDE-25) 37.5-25 MG tablet, Take 1 tablet by mouth daily., Disp: 90 tablet, Rfl: 0  No Known Allergies  Objective:   BP 128/68   Pulse 78   Temp 98.2 F (36.8 C)   Ht 5' 11 (1.803 m)   Wt 168 lb (76.2 kg)   SpO2 99%   BMI 23.43 kg/m      07/17/2024    8:33 AM 05/23/2024   11:31 AM 05/17/2024   12:12 PM  Vitals with BMI  Height 5' 11 5' 11   Weight 168 lbs 184 lbs   BMI 23.44 25.67   Systolic 128  138  Diastolic 68  76  Pulse 78       Physical  Exam Vitals and nursing note reviewed.  Constitutional:      Appearance: Normal appearance. He is normal weight.  HENT:     Head: Normocephalic and atraumatic.     Right Ear: Hearing, tympanic membrane, ear canal and external ear normal.     Left Ear: Hearing, tympanic membrane, ear canal and external ear normal.     Ears:     Weber exam findings: Does not lateralize.    Right Rinne: BC > AC.    Left Rinne: BC > AC. Skin:    General: Skin is warm and dry.     Capillary Refill: Capillary refill takes less than 2 seconds.  Neurological:     General: No focal deficit present.     Mental Status: He is alert and oriented to person, place, and time. Mental status is at baseline.  Psychiatric:        Mood and Affect: Mood normal.        Behavior: Behavior normal.        Thought Content: Thought content normal.        Judgment: Judgment normal.     Assessment & Plan:  Tinnitus of left ear Assessment & Plan: Left ear tinnitus described as a low humming, no abnormal exam findings or hearing loss, unable to identify any triggers. Advised watchful waiting and if persistent or more bothersome will consider ENT referral for further workup.       Follow up plan: Return if symptoms worsen or fail to improve.  Jeoffrey GORMAN Barrio, FNP

## 2024-07-17 NOTE — Assessment & Plan Note (Signed)
 Left ear tinnitus described as a low humming, no abnormal exam findings or hearing loss, unable to identify any triggers. Advised watchful waiting and if persistent or more bothersome will consider ENT referral for further workup.

## 2024-08-06 DIAGNOSIS — H401131 Primary open-angle glaucoma, bilateral, mild stage: Secondary | ICD-10-CM | POA: Diagnosis not present

## 2024-08-06 DIAGNOSIS — H26492 Other secondary cataract, left eye: Secondary | ICD-10-CM | POA: Diagnosis not present

## 2024-08-06 DIAGNOSIS — H43813 Vitreous degeneration, bilateral: Secondary | ICD-10-CM | POA: Diagnosis not present

## 2024-08-06 DIAGNOSIS — H04123 Dry eye syndrome of bilateral lacrimal glands: Secondary | ICD-10-CM | POA: Diagnosis not present

## 2024-08-06 DIAGNOSIS — Z961 Presence of intraocular lens: Secondary | ICD-10-CM | POA: Diagnosis not present

## 2024-08-09 DIAGNOSIS — H26492 Other secondary cataract, left eye: Secondary | ICD-10-CM | POA: Diagnosis not present

## 2024-08-19 ENCOUNTER — Other Ambulatory Visit: Payer: Self-pay

## 2024-08-19 DIAGNOSIS — E119 Type 2 diabetes mellitus without complications: Secondary | ICD-10-CM

## 2024-08-19 DIAGNOSIS — I10 Essential (primary) hypertension: Secondary | ICD-10-CM

## 2024-08-19 DIAGNOSIS — E785 Hyperlipidemia, unspecified: Secondary | ICD-10-CM

## 2024-08-19 NOTE — Telephone Encounter (Signed)
 Prescription Request  08/19/2024  LOV: 07/17/24  What is the name of the medication or equipment? metFORMIN  (GLUCOPHAGE ) 500 MG tablet [[id number]]   Have you contacted your pharmacy to request a refill? Yes   Which pharmacy would you like this sent to?  [facility] - [place] - [address] [place] ([place]) [place] [address] Phone: [phone] Fax: [phone]    Patient notified that their request is being sent to the clinical staff for review and that they should receive a response within 2 business days.   Please advise at [facility] [phone]  Prescription Request  08/19/2024  LOV: 07/17/24  What is the name of the medication or equipment? benazepril  (LOTENSIN ) 10 MG tablet [[id number]]   Have you contacted your pharmacy to request a refill? Yes   Which pharmacy would you like this sent to?  [facility] - [place]), [place] - [address] [place] ([place]) [place] [address] Phone: [phone] Fax: [phone]    Patient notified that their request is being sent to the clinical staff for review and that they should receive a response within 2 business days.   Please advise at [facility] [phone]  Prescription Request  08/19/2024  LOV: 07/17/24  What is the name of the medication or equipment? atorvastatin  (LIPITOR) 10 MG tablet [[id number]]   Have you contacted your pharmacy to request a refill? Yes   Which pharmacy would you like this sent to?  [facility] - [place] - [address] [place] ([place]) [place] [address] Phone: [phone] Fax: [phone]    Patient notified that their request is being sent to the clinical staff for review and that they should receive a response within 2 business days.   Please advise at [facility] [phone]

## 2024-08-22 MED ORDER — ATORVASTATIN CALCIUM 10 MG PO TABS: 10.0000 mg | ORAL_TABLET | Freq: Every day | ORAL | 0 refills | Status: AC

## 2024-08-22 MED ORDER — METFORMIN HCL 500 MG PO TABS: 500.0000 mg | ORAL_TABLET | Freq: Two times a day (BID) | ORAL | 0 refills | Status: AC

## 2024-08-22 MED ORDER — BENAZEPRIL HCL 10 MG PO TABS: 10.0000 mg | ORAL_TABLET | Freq: Every day | ORAL | 0 refills | Status: AC

## 2024-08-22 NOTE — Telephone Encounter (Signed)
 OV 05/17/24 Requested Prescriptions  Pending Prescriptions Disp Refills   metFORMIN  (GLUCOPHAGE ) 500 MG tablet 180 tablet 0    Sig: Take 1 tablet (500 mg total) by mouth 2 (two) times daily.     Endocrinology:  Diabetes - Biguanides Failed - 08/22/2024  1:20 PM      Failed - B12 Level in normal range and within 720 days    Vitamin B-12  Date Value Ref Range Status  01/30/2020 >2,000 (H) 200 - 1,100 pg/mL Final         Passed - Cr in normal range and within 360 days    Creat  Date Value Ref Range Status  05/17/2024 1.18 0.70 - 1.22 mg/dL Final   Creatinine, Urine  Date Value Ref Range Status  05/17/2024 157 20 - 320 mg/dL Final         Passed - HBA1C is between 0 and 7.9 and within 180 days    Hgb A1C (fingerstick)  Date Value Ref Range Status  03/22/2017 5.9 (H) <5.7 % Final    Comment:                                                                           According to the ADA Clinical Practice Recommendations for 2011, when HbA1c is used as a screening test:     >=6.5%   Diagnostic of Diabetes Mellitus            (if abnormal result is confirmed)   5.7-6.4%   Increased risk of developing Diabetes Mellitus   References:Diagnosis and Classification of Diabetes Mellitus,Diabetes Care,2011,34(Suppl 1):S62-S69 and Standards of Medical Care in         Diabetes - 2011,Diabetes Care,2011,34 (Suppl 1):S11-S61.      Hgb A1c MFr Bld  Date Value Ref Range Status  05/17/2024 7.0 (H) <5.7 % Final    Comment:    For someone without known diabetes, a hemoglobin A1c value of 6.5% or greater indicates that they may have  diabetes and this should be confirmed with a follow-up  test. . For someone with known diabetes, a value <7% indicates  that their diabetes is well controlled and a value  greater than or equal to 7% indicates suboptimal  control. A1c targets should be individualized based on  duration of diabetes, age, comorbid conditions, and  other  considerations. . Currently, no consensus exists regarding use of hemoglobin A1c for diagnosis of diabetes for children. .          Passed - eGFR in normal range and within 360 days    GFR, Est African American  Date Value Ref Range Status  09/21/2020 59 (L) > OR = 60 mL/min/1.54m2 Final   GFR, Est Non African American  Date Value Ref Range Status  09/21/2020 51 (L) > OR = 60 mL/min/1.80m2 Final   eGFR  Date Value Ref Range Status  05/17/2024 61 > OR = 60 mL/min/1.84m2 Final         Passed - Valid encounter within last 6 months    Recent Outpatient Visits           1 month ago Tinnitus of left ear   [place] [facility] Kayla, [facility]  S, FNP   3 months ago Diabetes mellitus without complication [facility])   [place] [facility] Pickard, Butler DASEN, MD   1 year ago Diabetes mellitus without complication [facility])   [place] [facility] Pickard, Butler DASEN, MD              Passed - CBC within normal limits and completed in the last 12 months    WBC  Date Value Ref Range Status  05/17/2024 5.5 3.8 - 10.8 Thousand/uL Final   RBC  Date Value Ref Range Status  05/17/2024 3.78 (L) 4.20 - 5.80 Million/uL Final   Hemoglobin  Date Value Ref Range Status  05/17/2024 12.8 (L) 13.2 - 17.1 g/dL Final   HCT  Date Value Ref Range Status  05/17/2024 37.7 (L) 38.5 - 50.0 % Final   MCHC  Date Value Ref Range Status  05/17/2024 34.0 32.0 - 36.0 g/dL Final    Comment:    For adults, a slight decrease in the calculated MCHC value (in the range of 30 to 32 g/dL) is most likely not clinically significant; however, it should be interpreted with caution in correlation with other red cell parameters and the patient's clinical condition.    [facility]  Date Value Ref Range Status  05/17/2024 33.9 (H) 27.0 - 33.0 pg Final   MCV  Date Value Ref Range Status  05/17/2024 99.7 80.0 - 100.0 fL Final   No results found for: PLTCOUNTKUC,  LABPLAT, POCPLA RDW  Date Value Ref Range Status  05/17/2024 12.6 11.0 - 15.0 % Final          benazepril  (LOTENSIN ) 10 MG tablet 90 tablet 0    Sig: Take 1 tablet (10 mg total) by mouth daily.     Cardiovascular:  ACE Inhibitors Passed - 08/22/2024  1:20 PM      Passed - Cr in normal range and within 180 days    Creat  Date Value Ref Range Status  05/17/2024 1.18 0.70 - 1.22 mg/dL Final   Creatinine, Urine  Date Value Ref Range Status  05/17/2024 157 20 - 320 mg/dL Final         Passed - K in normal range and within 180 days    Potassium  Date Value Ref Range Status  05/17/2024 4.3 3.5 - 5.3 mmol/L Final         Passed - Patient is not pregnant      Passed - Last BP in normal range    BP Readings from Last 1 Encounters:  07/17/24 128/68         Passed - Valid encounter within last 6 months    Recent Outpatient Visits           1 month ago Tinnitus of left ear   [place] [facility] Kayla Jeoffrey RAMAN, FNP   3 months ago Diabetes mellitus without complication [facility])   [place] [facility] Duanne Butler DASEN, MD   1 year ago Diabetes mellitus without complication [facility])   [place] [facility] Pickard, Butler DASEN, MD               atorvastatin  (LIPITOR) 10 MG tablet 90 tablet 0    Sig: Take 1 tablet (10 mg total) by mouth daily with breakfast.     Cardiovascular:  Antilipid - Statins Failed - 08/22/2024  1:20 PM      Failed - Lipid Panel in normal range  within the last 12 months    Cholesterol  Date Value Ref Range Status  05/17/2024 188 <200 mg/dL Final   LDL Cholesterol (Calc)  Date Value Ref Range Status  05/17/2024 93 mg/dL (calc) Final    Comment:    Reference range: <100 . Desirable range <100 mg/dL for primary prevention;   <70 mg/dL for patients with CHD or diabetic patients  with > or = 2 CHD risk factors. SABRA LDL-C is now calculated using the Martin-Hopkins  calculation, which is a  validated novel method providing  better accuracy than the Friedewald equation in the  estimation of LDL-C.  Gladis APPLETHWAITE et al. SANDREA. 7986;689(80): 2061-2068  (http://education.[facility].com/faq/FAQ164)    HDL  Date Value Ref Range Status  05/17/2024 70 > OR = 40 mg/dL Final   Triglycerides  Date Value Ref Range Status  05/17/2024 158 (H) <150 mg/dL Final         Passed - Patient is not pregnant      Passed - Valid encounter within last 12 months    Recent Outpatient Visits           1 month ago Tinnitus of left ear   [facility] Kayla Jeoffrey RAMAN, FNP   3 months ago Diabetes mellitus without complication [facility])   [place] [facility] Pickard, Butler DASEN, MD   1 year ago Diabetes mellitus without complication [facility])   [place] [facility] Pickard, Butler DASEN, MD

## 2024-08-27 ENCOUNTER — Other Ambulatory Visit: Payer: Self-pay | Admitting: Family Medicine

## 2024-09-23 NOTE — Progress Notes (Signed)
 Pharmacy Quality Measure Review  This patient is appearing on a report for being at risk of failing the adherence measure for diabetes medications this calendar year.   Medication: Metformin  500 mg Last fill date: 08/22/24 for 90 day supply  Insurance report was not up to date. No action needed at this time.   Jenkins Graces, PharmD PGY1 Pharmacy Resident

## 2025-05-29 ENCOUNTER — Encounter
